# Patient Record
Sex: Male | Born: 1975 | Race: Black or African American | Hispanic: No | Marital: Married | Smoking: Current some day smoker
Health system: Southern US, Community
[De-identification: ages and names within clinical notes are randomized; demographics above are authoritative.]

## PROBLEM LIST (undated history)

## (undated) HISTORY — PX: BACK SURGERY: SHX140

---

## 2001-04-02 ENCOUNTER — Emergency Department (HOSPITAL_COMMUNITY): Admission: EM | Admit: 2001-04-02 | Discharge: 2001-04-02 | Payer: Self-pay | Admitting: Emergency Medicine

## 2001-04-02 ENCOUNTER — Encounter: Payer: Self-pay | Admitting: Emergency Medicine

## 2006-03-01 ENCOUNTER — Emergency Department (HOSPITAL_COMMUNITY): Admission: EM | Admit: 2006-03-01 | Discharge: 2006-03-01 | Payer: Self-pay | Admitting: Emergency Medicine

## 2006-03-03 ENCOUNTER — Emergency Department (HOSPITAL_COMMUNITY): Admission: EM | Admit: 2006-03-03 | Discharge: 2006-03-03 | Payer: Self-pay | Admitting: Emergency Medicine

## 2006-07-02 ENCOUNTER — Emergency Department (HOSPITAL_COMMUNITY): Admission: EM | Admit: 2006-07-02 | Discharge: 2006-07-02 | Payer: Self-pay | Admitting: Emergency Medicine

## 2010-06-08 ENCOUNTER — Emergency Department (HOSPITAL_COMMUNITY): Admission: EM | Admit: 2010-06-08 | Discharge: 2010-06-08 | Payer: Self-pay | Admitting: Emergency Medicine

## 2010-09-08 ENCOUNTER — Emergency Department (HOSPITAL_COMMUNITY): Admission: EM | Admit: 2010-09-08 | Discharge: 2010-09-08 | Payer: Self-pay | Admitting: Emergency Medicine

## 2010-12-21 ENCOUNTER — Emergency Department (HOSPITAL_COMMUNITY)
Admission: EM | Admit: 2010-12-21 | Discharge: 2010-12-21 | Disposition: A | Discharge status: Home / Self Care | Payer: Self-pay | Attending: Emergency Medicine | Admitting: Emergency Medicine

## 2010-12-21 DIAGNOSIS — L02219 Cutaneous abscess of trunk, unspecified: Secondary | ICD-10-CM | POA: Insufficient documentation

## 2010-12-24 LAB — CULTURE, ROUTINE-ABSCESS

## 2010-12-26 LAB — POCT I-STAT, CHEM 8
Chloride: 109 mEq/L (ref 96–112)
Glucose, Bld: 106 mg/dL — ABNORMAL HIGH (ref 70–99)
HCT: 44 % (ref 39.0–52.0)
Potassium: 3.6 mEq/L (ref 3.5–5.1)

## 2016-07-06 ENCOUNTER — Ambulatory Visit: Payer: Medicaid Other | Admitting: Unknown Physician Specialty

## 2020-02-16 ENCOUNTER — Other Ambulatory Visit
Admit: 2020-02-16 | Discharge: 2020-02-16 | Disposition: A | Discharge status: Home / Self Care | Attending: Family Medicine | Admitting: Family Medicine
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
## 2020-05-29 ENCOUNTER — Emergency Department: Admission: EM | Admit: 2020-05-29 | Source: Home / Self Care

## 2021-06-03 ENCOUNTER — Other Ambulatory Visit: Payer: Self-pay

## 2021-06-03 ENCOUNTER — Encounter (HOSPITAL_COMMUNITY): Payer: Self-pay | Admitting: *Deleted

## 2021-06-03 ENCOUNTER — Emergency Department (HOSPITAL_COMMUNITY): Payer: Self-pay

## 2021-06-03 ENCOUNTER — Emergency Department (HOSPITAL_COMMUNITY)
Admission: EM | Admit: 2021-06-03 | Discharge: 2021-06-03 | Disposition: A | Discharge status: Home / Self Care | Payer: Self-pay | Attending: Emergency Medicine | Admitting: Emergency Medicine

## 2021-06-03 DIAGNOSIS — Y92410 Unspecified street and highway as the place of occurrence of the external cause: Secondary | ICD-10-CM | POA: Insufficient documentation

## 2021-06-03 DIAGNOSIS — M25532 Pain in left wrist: Secondary | ICD-10-CM | POA: Diagnosis not present

## 2021-06-03 DIAGNOSIS — M25561 Pain in right knee: Secondary | ICD-10-CM | POA: Diagnosis not present

## 2021-06-03 DIAGNOSIS — R519 Headache, unspecified: Secondary | ICD-10-CM | POA: Insufficient documentation

## 2021-06-03 DIAGNOSIS — R1110 Vomiting, unspecified: Secondary | ICD-10-CM | POA: Diagnosis not present

## 2021-06-03 DIAGNOSIS — M546 Pain in thoracic spine: Secondary | ICD-10-CM | POA: Diagnosis not present

## 2021-06-03 DIAGNOSIS — F17210 Nicotine dependence, cigarettes, uncomplicated: Secondary | ICD-10-CM | POA: Insufficient documentation

## 2021-06-03 DIAGNOSIS — R42 Dizziness and giddiness: Secondary | ICD-10-CM | POA: Insufficient documentation

## 2021-06-03 DIAGNOSIS — M25562 Pain in left knee: Secondary | ICD-10-CM | POA: Insufficient documentation

## 2021-06-03 DIAGNOSIS — S6991XA Unspecified injury of right wrist, hand and finger(s), initial encounter: Secondary | ICD-10-CM | POA: Diagnosis present

## 2021-06-03 DIAGNOSIS — S61411A Laceration without foreign body of right hand, initial encounter: Secondary | ICD-10-CM | POA: Diagnosis not present

## 2021-06-03 MED ORDER — CYCLOBENZAPRINE HCL 10 MG PO TABS: 10.0000 mg | ORAL_TABLET | Freq: Two times a day (BID) | ORAL | 0 refills | Status: DC | PRN

## 2021-06-03 NOTE — Discharge Instructions (Addendum)
Imaging was all reassuring.  I suspect her pain is from a muscular strain recommend over-the-counter pain medications as needed.  I have also given you a prescription for a muscle relaxer this can make you drowsy do not consume alcohol or operate heavy machinery when taking this medication.  Headache likely a concussion, recommend brain rest, decreasing screen time, mentally stimulating activities, exercising and I would reintroduce them as tolerated.  Come back to the emergency department if you develop chest pain, shortness of breath, severe abdominal pain, uncontrolled nausea, vomiting, diarrhea.

## 2021-06-03 NOTE — ED Notes (Signed)
Pt ambulated to room 7. Pt verbalized he does not need ice pack. No jewelry to remove from knee or back/shoulders.
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
## 2021-06-03 NOTE — ED Triage Notes (Signed)
Also would like recheck of recently sutured right hand
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
## 2023-08-10 ENCOUNTER — Emergency Department (HOSPITAL_COMMUNITY)
Admission: EM | Admit: 2023-08-10 | Discharge: 2023-08-10 | Disposition: A | Discharge status: Home / Self Care | Payer: Medicaid Other | Source: Home / Self Care | Attending: Emergency Medicine | Admitting: Emergency Medicine

## 2023-08-10 ENCOUNTER — Emergency Department (HOSPITAL_COMMUNITY): Payer: Medicaid Other

## 2023-08-10 ENCOUNTER — Encounter (HOSPITAL_COMMUNITY): Payer: Self-pay

## 2023-08-10 ENCOUNTER — Other Ambulatory Visit: Payer: Self-pay

## 2023-08-10 DIAGNOSIS — Z1152 Encounter for screening for COVID-19: Secondary | ICD-10-CM | POA: Insufficient documentation

## 2023-08-10 DIAGNOSIS — J181 Lobar pneumonia, unspecified organism: Secondary | ICD-10-CM | POA: Insufficient documentation

## 2023-08-10 DIAGNOSIS — J189 Pneumonia, unspecified organism: Secondary | ICD-10-CM

## 2023-08-10 LAB — LACTIC ACID, PLASMA
Lactic Acid, Venous: 1.1 mmol/L (ref 0.5–1.9)
Lactic Acid, Venous: 0.8 mmol/L (ref 0.5–1.9)

## 2023-08-10 LAB — BASIC METABOLIC PANEL
Anion gap: 10 (ref 5–15)
BUN: 6 mg/dL (ref 6–20)
CO2: 26 mmol/L (ref 22–32)
Calcium: 8.5 mg/dL — ABNORMAL LOW (ref 8.9–10.3)
Chloride: 97 mmol/L — ABNORMAL LOW (ref 98–111)
Creatinine, Ser: 0.78 mg/dL (ref 0.61–1.24)
GFR, Estimated: >60 mL/min (ref >60)
Glucose, Bld: 199 mg/dL — ABNORMAL HIGH (ref 70–99)
Potassium: 3.8 mmol/L (ref 3.5–5.1)
Sodium: 133 mmol/L — ABNORMAL LOW (ref 135–145)

## 2023-08-10 LAB — CBC
HCT: 41 % (ref 39.0–52.0)
Hemoglobin: 13.6 g/dL (ref 13.0–17.0)
MCH: 32.7 pg (ref 26.0–34.0)
MCHC: 33.2 g/dL (ref 30.0–36.0)
MCV: 98.6 fL (ref 80.0–100.0)
Platelets: 263 10*3/uL (ref 150–400)
RBC: 4.16 MIL/uL — ABNORMAL LOW (ref 4.22–5.81)
RDW: 13.6 % (ref 11.5–15.5)
WBC: 14.5 10*3/uL — ABNORMAL HIGH (ref 4.0–10.5)
nRBC: 0 % (ref 0.0–0.2)

## 2023-08-10 LAB — PROTIME-INR
INR: 1.1 (ref 0.8–1.2)
Prothrombin Time: 14.7 s (ref 11.4–15.2)

## 2023-08-10 LAB — RESP PANEL BY RT-PCR (RSV, FLU A&B, COVID)  RVPGX2
Influenza A by PCR: NEGATIVE
Influenza B by PCR: NEGATIVE
Resp Syncytial Virus by PCR: NEGATIVE
SARS Coronavirus 2 by RT PCR: NEGATIVE

## 2023-08-10 LAB — TROPONIN I (HIGH SENSITIVITY): Troponin I (High Sensitivity): 2 ng/L (ref <18)

## 2023-08-10 LAB — APTT: aPTT: 30 s (ref 24–36)

## 2023-08-10 MED ORDER — HYDROCODONE-ACETAMINOPHEN 5-325 MG PO TABS: ORAL_TABLET | ORAL | 0 refills | Status: DC

## 2023-08-10 MED ORDER — ACETAMINOPHEN 500 MG PO TABS
1000.0000 mg | ORAL_TABLET | Freq: Once | ORAL | Status: AC
  Administered 2023-08-10: 1000 mg via ORAL
  Filled 2023-08-10: qty 2

## 2023-08-10 MED ORDER — DOXYCYCLINE HYCLATE 100 MG PO CAPS: 100.0000 mg | ORAL_CAPSULE | Freq: Two times a day (BID) | ORAL | 0 refills | Status: DC

## 2023-08-10 MED ORDER — SODIUM CHLORIDE 0.9 % IV SOLN
500.0000 mg | INTRAVENOUS | Status: DC
  Administered 2023-08-10: 500 mg via INTRAVENOUS
  Filled 2023-08-10: qty 5

## 2023-08-10 MED ORDER — SODIUM CHLORIDE 0.9 % IV BOLUS
1000.0000 mL | Freq: Once | INTRAVENOUS | Status: AC
Start: 2023-08-10 — End: 2023-08-10
  Administered 2023-08-10: 1000 mL via INTRAVENOUS

## 2023-08-10 MED ORDER — SODIUM CHLORIDE 0.9 % IV SOLN
2.0000 g | INTRAVENOUS | Status: DC
  Administered 2023-08-10: 2 g via INTRAVENOUS
  Filled 2023-08-10: qty 20

## 2023-08-10 NOTE — ED Provider Notes (Signed)
[facility] EMERGENCY DEPARTMENT AT [facility] Provider Note   CSN: [id number] Arrival date & time: 08/10/23  1200     History {Add pertinent medical, surgical, social history, OB history to HPI:1} Chief Complaint  Patient presents with   Chest Pain   Shortness of Breath    Glenn Oconnor is a 47 y.o. male.  Patient complains of pain in left chest.  Minimal cough   Chest Pain Associated symptoms: shortness of breath   Shortness of Breath Associated symptoms: chest pain        Home Medications Prior to Admission medications   Medication Sig Start Date End Date Taking? Authorizing Provider  doxycycline (VIBRAMYCIN) 100 MG capsule Take 1 capsule (100 mg total) by mouth 2 (two) times daily. One po bid x 7 days 08/10/23  Yes Bethann Berkshire, MD  HYDROcodone-acetaminophen (NORCO/VICODIN) 5-325 MG tablet Take 1 every 6 hours as needed for pain not relieved by Tylenol or Motrin alone 08/10/23  Yes Bethann Berkshire, MD  cyclobenzaprine (FLEXERIL) 10 MG tablet Take 1 tablet (10 mg total) by mouth 2 (two) times daily as needed for muscle spasms. 06/03/21   Carroll Sage, PA-C  levofloxacin (LEVAQUIN) 500 MG tablet Take 500 mg by mouth daily. 05/27/21   [provider]      Allergies    Patient has no known allergies.    Review of Systems   Review of Systems  Respiratory:  Positive for shortness of breath.   Cardiovascular:  Positive for chest pain.    Physical Exam Updated Vital Signs BP 113/63   Pulse 71   Temp (!) 101.9 F (38.8 C) (Oral)   Resp (!) 26   Ht 6' (1.829 m)   Wt 77.1 kg   SpO2 98%   BMI 23.06 kg/m  Physical Exam  ED Results / Procedures / Treatments   Labs (all labs ordered are listed, but only abnormal results are displayed) Labs Reviewed  BASIC METABOLIC PANEL - Abnormal; Notable for the following components:      Result Value   Sodium 133 (*)    Chloride 97 (*)    Glucose, Bld 199 (*)    Calcium 8.5 (*)    All other  components within normal limits  CBC - Abnormal; Notable for the following components:   WBC 14.5 (*)    RBC 4.16 (*)    All other components within normal limits  RESP PANEL BY RT-PCR (RSV, FLU A&B, COVID)  RVPGX2  CULTURE, BLOOD (ROUTINE X 2)  CULTURE, BLOOD (ROUTINE X 2)  LACTIC ACID, PLASMA  PROTIME-INR  APTT  LACTIC ACID, PLASMA  URINALYSIS, W/ REFLEX TO CULTURE (INFECTION SUSPECTED)  TROPONIN I (HIGH SENSITIVITY)    EKG None  Radiology DG Chest 2 View  Result Date: 08/10/2023 CLINICAL DATA:  Chest pain. EXAM: CHEST - 2 VIEW COMPARISON:  None Available. FINDINGS: There is an approximately 3.5 x 5.5 cm opacity overlying the left lateral costophrenic angle with associated small left pleural effusion. Follow-up to clearing is recommended. Bilateral lung fields are otherwise clear. Right costophrenic angle is clear. Normal cardio-mediastinal silhouette. No acute osseous abnormalities. The soft tissues are within normal limits. IMPRESSION: *Small left lung lower lobe pneumonia with associated small left pleural effusion. Follow-up to clearing is recommended. Electronically Signed   By: Jules Schick M.D.   On: 08/10/2023 13:39    Procedures Procedures  {Document cardiac monitor, telemetry assessment procedure when appropriate:1}  Medications Ordered in ED Medications  cefTRIAXone (  ROCEPHIN) 2 g in sodium chloride 0.9 % 100 mL IVPB (0 g Intravenous Stopped 08/10/23 1338)  azithromycin (ZITHROMAX) 500 mg in sodium chloride 0.9 % 250 mL IVPB (500 mg Intravenous New Bag/Given 08/10/23 1340)  acetaminophen (TYLENOL) tablet 1,000 mg (1,000 mg Oral Given 08/10/23 1213)  sodium chloride 0.9 % bolus 1,000 mL (1,000 mLs Intravenous New Bag/Given 08/10/23 1304)    ED Course/ Medical Decision Making/ A&P   {   Click here for ABCD2, HEART and other calculatorsREFRESH Note before signing :1}                              Medical Decision Making Amount and/or Complexity of Data  Reviewed Labs: ordered. Radiology: ordered. ECG/medicine tests: ordered.  Risk OTC drugs. Prescription drug management.  Patient with left lower lobe pneumonia.  Patient is not hypoxic or toxic and will be treated with doxycycline as an outpatient follow-up with PCP  {Document critical care time when appropriate:1} {Document review of labs and clinical decision tools ie heart score, Chads2Vasc2 etc:1}  {Document your independent review of radiology images, and any outside records:1} {Document your discussion with family members, caretakers, and with consultants:1} {Document social determinants of health affecting pt's care:1} {Document your decision making why or why not admission, treatments were needed:1} Final Clinical Impression(s) / ED Diagnoses Final diagnoses:  Community acquired pneumonia of left lower lobe of lung    Rx / DC Orders ED Discharge Orders          Ordered    doxycycline (VIBRAMYCIN) 100 MG capsule  2 times daily        08/10/23 1421    HYDROcodone-acetaminophen (NORCO/VICODIN) 5-325 MG tablet        08/10/23 1421

## 2023-08-10 NOTE — Sepsis Progress Note (Signed)
Confirmed with bedside RN [place] that blood cultures were done before the antibiotic was given.

## 2023-08-10 NOTE — Discharge Instructions (Signed)
Follow up with your md next week or sooner if not improving.
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
## 2023-08-10 NOTE — Sepsis Progress Note (Signed)
eLink is following this Code Sepsis. °

## 2023-08-11 ENCOUNTER — Encounter (HOSPITAL_COMMUNITY): Payer: Self-pay | Admitting: Emergency Medicine

## 2023-08-11 ENCOUNTER — Emergency Department (HOSPITAL_COMMUNITY): Payer: Medicaid Other

## 2023-08-11 ENCOUNTER — Inpatient Hospital Stay (HOSPITAL_COMMUNITY)
Admission: EM | Admit: 2023-08-11 | Discharge: 2023-08-14 | DRG: 175 | Disposition: A | Discharge status: Home / Self Care | Payer: Medicaid Other | Attending: Family Medicine | Admitting: Family Medicine

## 2023-08-11 ENCOUNTER — Other Ambulatory Visit: Payer: Self-pay

## 2023-08-11 DIAGNOSIS — Z1152 Encounter for screening for COVID-19: Secondary | ICD-10-CM | POA: Diagnosis not present

## 2023-08-11 DIAGNOSIS — I82409 Acute embolism and thrombosis of unspecified deep veins of unspecified lower extremity: Secondary | ICD-10-CM | POA: Diagnosis present

## 2023-08-11 DIAGNOSIS — I82402 Acute embolism and thrombosis of unspecified deep veins of left lower extremity: Secondary | ICD-10-CM | POA: Diagnosis not present

## 2023-08-11 DIAGNOSIS — F17210 Nicotine dependence, cigarettes, uncomplicated: Secondary | ICD-10-CM | POA: Diagnosis present

## 2023-08-11 DIAGNOSIS — J189 Pneumonia, unspecified organism: Secondary | ICD-10-CM | POA: Diagnosis present

## 2023-08-11 DIAGNOSIS — Z720 Tobacco use: Secondary | ICD-10-CM

## 2023-08-11 DIAGNOSIS — I2699 Other pulmonary embolism without acute cor pulmonale: Secondary | ICD-10-CM | POA: Diagnosis not present

## 2023-08-11 DIAGNOSIS — I82432 Acute embolism and thrombosis of left popliteal vein: Secondary | ICD-10-CM | POA: Diagnosis present

## 2023-08-11 DIAGNOSIS — I82442 Acute embolism and thrombosis of left tibial vein: Secondary | ICD-10-CM | POA: Diagnosis present

## 2023-08-11 DIAGNOSIS — R0602 Shortness of breath: Secondary | ICD-10-CM | POA: Diagnosis present

## 2023-08-11 DIAGNOSIS — I2609 Other pulmonary embolism with acute cor pulmonale: Secondary | ICD-10-CM | POA: Diagnosis not present

## 2023-08-11 LAB — CBC
HCT: 35.5 % — ABNORMAL LOW (ref 39.0–52.0)
Hemoglobin: 12 g/dL — ABNORMAL LOW (ref 13.0–17.0)
MCH: 32.7 pg (ref 26.0–34.0)
MCHC: 33.8 g/dL (ref 30.0–36.0)
MCV: 96.7 fL (ref 80.0–100.0)
Platelets: 265 10*3/uL (ref 150–400)
RBC: 3.67 MIL/uL — ABNORMAL LOW (ref 4.22–5.81)
RDW: 12.9 % (ref 11.5–15.5)
WBC: 15.2 10*3/uL — ABNORMAL HIGH (ref 4.0–10.5)
nRBC: 0 % (ref 0.0–0.2)

## 2023-08-11 LAB — MRSA NEXT GEN BY PCR, NASAL: MRSA by PCR Next Gen: NOT DETECTED

## 2023-08-11 LAB — COMPREHENSIVE METABOLIC PANEL
ALT: 14 U/L (ref 0–44)
AST: 18 U/L (ref 15–41)
Albumin: 2.9 g/dL — ABNORMAL LOW (ref 3.5–5.0)
Alkaline Phosphatase: 85 U/L (ref 38–126)
Anion gap: 9 (ref 5–15)
BUN: 5 mg/dL — ABNORMAL LOW (ref 6–20)
CO2: 24 mmol/L (ref 22–32)
Calcium: 8.4 mg/dL — ABNORMAL LOW (ref 8.9–10.3)
Chloride: 100 mmol/L (ref 98–111)
Creatinine, Ser: 0.65 mg/dL (ref 0.61–1.24)
GFR, Estimated: >60 mL/min (ref >60)
Glucose, Bld: 151 mg/dL — ABNORMAL HIGH (ref 70–99)
Potassium: 3.5 mmol/L (ref 3.5–5.1)
Sodium: 133 mmol/L — ABNORMAL LOW (ref 135–145)
Total Bilirubin: 1 mg/dL (ref 0.3–1.2)
Total Protein: 7.8 g/dL (ref 6.5–8.1)

## 2023-08-11 LAB — ABO/RH: ABO/RH(D): A POS

## 2023-08-11 LAB — TYPE AND SCREEN
ABO/RH(D): A POS
Antibody Screen: NEGATIVE

## 2023-08-11 LAB — PROCALCITONIN: Procalcitonin: 0.16 ng/mL

## 2023-08-11 LAB — HEPARIN LEVEL (UNFRACTIONATED): Heparin Unfractionated: <0.1 [IU]/mL — ABNORMAL LOW (ref 0.30–0.70)

## 2023-08-11 MED ORDER — ACETAMINOPHEN 325 MG PO TABS
650.0000 mg | ORAL_TABLET | Freq: Four times a day (QID) | ORAL | Status: DC | PRN
  Administered 2023-08-11 – 2023-08-12 (×2): 650 mg via ORAL
  Filled 2023-08-11 (×2): qty 2

## 2023-08-11 MED ORDER — IOHEXOL 350 MG/ML SOLN
75.0000 mL | Freq: Once | INTRAVENOUS | Status: AC | PRN
  Administered 2023-08-11: 75 mL via INTRAVENOUS

## 2023-08-11 MED ORDER — GUAIFENESIN-DM 100-10 MG/5ML PO SYRP
15.0000 mL | ORAL_SOLUTION | Freq: Three times a day (TID) | ORAL | Status: AC
  Administered 2023-08-11 – 2023-08-12 (×3): 15 mL via ORAL
  Filled 2023-08-11 (×3): qty 15

## 2023-08-11 MED ORDER — ONDANSETRON HCL 4 MG PO TABS: 4.0000 mg | ORAL_TABLET | Freq: Four times a day (QID) | ORAL | Status: DC | PRN

## 2023-08-11 MED ORDER — SODIUM CHLORIDE 0.9 % IV SOLN
2.0000 g | INTRAVENOUS | Status: DC
  Administered 2023-08-11 – 2023-08-13 (×3): 2 g via INTRAVENOUS
  Filled 2023-08-11 (×3): qty 20

## 2023-08-11 MED ORDER — HEPARIN (PORCINE) 25000 UT/250ML-% IV SOLN
1600.0000 [IU]/h | INTRAVENOUS | Status: DC
  Administered 2023-08-11: 1250 [IU]/h via INTRAVENOUS
  Administered 2023-08-12: 1600 [IU]/h via INTRAVENOUS
  Administered 2023-08-12: 1450 [IU]/h via INTRAVENOUS
  Filled 2023-08-11 (×2): qty 250

## 2023-08-11 MED ORDER — CHLORHEXIDINE GLUCONATE CLOTH 2 % EX PADS
6.0000 | MEDICATED_PAD | Freq: Every day | CUTANEOUS | Status: DC
  Administered 2023-08-12 – 2023-08-13 (×2): 6 via TOPICAL

## 2023-08-11 MED ORDER — HEPARIN BOLUS VIA INFUSION
4000.0000 [IU] | Freq: Once | INTRAVENOUS | Status: AC
  Administered 2023-08-11: 4000 [IU] via INTRAVENOUS

## 2023-08-11 MED ORDER — POLYETHYLENE GLYCOL 3350 17 G PO PACK: 17.0000 g | PACK | Freq: Every day | ORAL | Status: DC | PRN

## 2023-08-11 MED ORDER — POTASSIUM CHLORIDE CRYS ER 20 MEQ PO TBCR
40.0000 meq | EXTENDED_RELEASE_TABLET | Freq: Once | ORAL | Status: AC
  Administered 2023-08-11: 40 meq via ORAL
  Filled 2023-08-11: qty 2

## 2023-08-11 MED ORDER — HEPARIN BOLUS VIA INFUSION
3000.0000 [IU] | Freq: Once | INTRAVENOUS | Status: AC
  Administered 2023-08-11: 3000 [IU] via INTRAVENOUS
  Filled 2023-08-11: qty 3000

## 2023-08-11 MED ORDER — ONDANSETRON HCL 4 MG/2ML IJ SOLN: 4.0000 mg | Freq: Four times a day (QID) | INTRAMUSCULAR | Status: DC | PRN

## 2023-08-11 MED ORDER — HYDROCODONE-ACETAMINOPHEN 5-325 MG PO TABS
1.0000 | ORAL_TABLET | Freq: Four times a day (QID) | ORAL | Status: DC | PRN
  Administered 2023-08-11: 1 via ORAL
  Filled 2023-08-11: qty 1

## 2023-08-11 MED ORDER — SODIUM CHLORIDE 0.9 % IV SOLN
500.0000 mg | INTRAVENOUS | Status: DC
  Administered 2023-08-11: 500 mg via INTRAVENOUS
  Filled 2023-08-11: qty 5

## 2023-08-11 MED ORDER — ORAL CARE MOUTH RINSE: 15.0000 mL | OROMUCOSAL | Status: DC | PRN

## 2023-08-11 NOTE — Progress Notes (Signed)
PHARMACY - ANTICOAGULATION CONSULT NOTE  Pharmacy Consult for Heparin Indication: pulmonary embolus  No Known Allergies  Patient Measurements: Height: 6' (182.9 cm) Weight: 77.1 kg (169 lb 15.6 oz) IBW/kg (Calculated) : 77.6 HEPARIN DW (KG): 77.1   Vital Signs: Temp: 98 F (36.7 C) (10/30 1515) Temp Source: Oral (10/30 1151) BP: 115/71 (10/30 1515) Pulse Rate: 82 (10/30 1515)  Labs: Recent Labs    08/10/23 1232 08/11/23 1218  HGB 13.6 12.0*  HCT 41.0 35.5*  PLT 263 265  APTT 30  --   LABPROT 14.7  --   INR 1.1  --   CREATININE 0.78 0.65  TROPONINIHS 2  --     Estimated Creatinine Clearance: 127.2 mL/min (by C-G formula based on SCr of 0.65 mg/dL).   Medical History: History reviewed. No pertinent past medical history.  Medications:  See med rec  Assessment: Patient is 47 yo male who presented yesterday with SOB and has LEFT Flank pain x3 and fever. Today patient presents with hemoptysis. Patient is not on oral anticoagulants. CTAngiogram is + Pulmonary Embolus. Pharmacy asked to start heparin.   Goal of Therapy:  Heparin level 0.3-0.7 units/ml Monitor platelets by anticoagulation protocol: Yes   Plan:  Give 4000 units bolus x 1 Start heparin infusion at 1250 units/hr Check anti-Xa level in ~6 hours and daily while on heparin Continue to monitor H&H and platelets  Elder Cyphers, BS Pharm D, BCPS Clinical Pharmacist 08/11/2023,4:24 PM

## 2023-08-11 NOTE — Assessment & Plan Note (Signed)
Smokes half a pack of cigarettes daily.

## 2023-08-11 NOTE — Progress Notes (Signed)
   PCCM transfer request    Sending physician: Suzan Nailer PA  Sending facility: [facility]  Reason for transfer:  Low risk PE with hemoptysis  Brief case summary:  47 year old found to have PE. Recently diagnosed with Pna re-presented with hemoptysis. CTA revealed PE without sign of R heart strain, pulmonary infarct - likely cause of CXR infiltrate prior.   Recommendations made prior to transfer: --heparin, observe, once hemoptysis improves consider discharge  Transfer accepted: no    Glenn Oconnor 08/11/23 5:54 PM [facility]  For contact information, see Amion. If no response to pager, please call PCCM consult pager. After hours, 7PM- 7AM, please call Elink.

## 2023-08-11 NOTE — H&P (Signed)
History and Physical    Oak E Attar ZOX:[id number] DOB: June 24, 1976 DOA: 08/11/2023  PCP: Pcp, No   Patient coming from: Home  I have personally briefly reviewed patient's old medical records in [facility]  Chief Complaint: Difficulty Breathing  HPI: Glenn Oconnor is a 47 y.o. male with medical history significant for tobacco use.  Patient presented to the ED with complaints of difficulty breathing and coughing up blood.  Patient was in the ED yesterday with complaints of difficulty breathing, right sided chest pains, fevers of up to 102 at home.  Chest x-ray done in the ED showed small left lower lobe pneumonia with associated small left pleural effusion, he was given ceftriaxone and azithromycin in the ED, discharged with a prescription for doxycycline, he was started on doxycycline and Norco.  This morning at about 3 AM, he started coughing up blood. He reports previously he had tried not to cough, but after taking the pain medication, he had relief of the chest pain and he was able to cough.  He coughed up blood 4 times, it was dark and thick.  Last time he coughed up blood was 9 AM this morning.  He reports all this started with cramping pain to his bilateral calfs without swelling or redness, that started  about 8 days ago, later moved up to his hip and then he started having chest pain about 5 days ago No personal or family history of blood clots.  He denies previous history of blood clots, no recent trips-lights on long drives, no recent surgeries.  Had back surgery back in February but he has since been ambulatory, and active.  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  No diagnosis of cancer.  Not on any medications.  ED Course: Blood pressure 112-131.  Heart rate 80-98.  Tmax 99.8.  WBC 15.2.  Hemoglobin 12. CTA chest showed acute PE- Acute pulmonary emboli within the left main pulmonary artery extending to the left interlobar artery and the upper and lower lobe  segmental branches. Acute pulmonary emboli also noted in the right lower lobe segmental branches. 2. Peripheral predominant wedge-shaped opacities and patchy ground-glass changes within the posterior left lower lobe are concerning for infarction. IV heparin started. EDP talked to Dr. Judeth Horn- recommended, observe, once hemoptysis improves consider discharge.Raelyn Number low risk PE with hemoptysis.  Review of Systems: As per HPI all other systems reviewed and negative.  History reviewed. No pertinent past medical history.  Past Surgical History:  Procedure Laterality Date   BACK SURGERY       reports that he has been smoking cigarettes. He has never used smokeless tobacco. He reports current alcohol use. He reports that he does not use drugs.  No Known Allergies  No family history of blood clots.  Prior to Admission medications   Medication Sig Start Date End Date Taking? Authorizing Provider  doxycycline (VIBRAMYCIN) 100 MG capsule Take 1 capsule (100 mg total) by mouth 2 (two) times daily. One po bid x 7 days 08/10/23  Yes Bethann Berkshire, MD  HYDROcodone-acetaminophen (NORCO/VICODIN) 5-325 MG tablet Take 1 every 6 hours as needed for pain not relieved by Tylenol or Motrin alone 08/10/23  Yes Bethann Berkshire, MD  ibuprofen (ADVIL) 200 MG tablet Take 200 mg by mouth every 6 (six) hours as needed for mild pain (pain score 1-3).   Yes [provider]  naproxen sodium (ALEVE) 220 MG tablet Take 220 mg by mouth  daily as needed (pain).   Yes [provider]    Physical Exam: Vitals:   08/11/23 1151 08/11/23 1151 08/11/23 1459 08/11/23 1515  BP:  113/70 112/77 115/71  Pulse:  92 80 82  Resp:  18 20 17   Temp:  99.8 F (37.7 C)  98 F (36.7 C)  TempSrc:  Oral    SpO2:  100% 97% 97%  Weight: 77.1 kg     Height: 6' (1.829 m)       Constitutional: NAD, calm, comfortable Vitals:   08/11/23 1151 08/11/23 1151 08/11/23 1459 08/11/23 1515  BP:  113/70 112/77 115/71   Pulse:  92 80 82  Resp:  18 20 17   Temp:  99.8 F (37.7 C)  98 F (36.7 C)  TempSrc:  Oral    SpO2:  100% 97% 97%  Weight: 77.1 kg     Height: 6' (1.829 m)      Eyes: PERRL, lids and conjunctivae normal ENMT: Mucous membranes are moist.  Neck: normal, supple, no masses, no thyromegaly Respiratory: clear to auscultation bilaterally, no wheezing, no crackles. Normal respiratory effort. No accessory muscle use.  Cardiovascular: Regular rate and rhythm, no murmurs / rubs / gallops. No extremity edema.  Extremities warm.   Abdomen: no tenderness, no masses palpated. No hepatosplenomegaly. Bowel sounds positive.  Musculoskeletal: no clubbing / cyanosis. No joint deformity upper and lower extremities.  Neurologic: No facial symmetry, moving extremities spontaneously. Psychiatric: Normal judgment and insight. Alert and oriented x 3. Normal mood.   Labs on Admission: I have personally reviewed following labs and imaging studies  CBC: Recent Labs  Lab 08/10/23 1232 08/11/23 1218  WBC 14.5* 15.2*  HGB 13.6 12.0*  HCT 41.0 35.5*  MCV 98.6 96.7  PLT 263 265   Basic Metabolic Panel: Recent Labs  Lab 08/10/23 1232 08/11/23 1218  NA 133* 133*  K 3.8 3.5  CL 97* 100  CO2 26 24  GLUCOSE 199* 151*  BUN 6 5*  CREATININE 0.78 0.65  CALCIUM 8.5* 8.4*   GFR: Estimated Creatinine Clearance: 127.2 mL/min (by C-G formula based on SCr of 0.65 mg/dL). Liver Function Tests: Recent Labs  Lab 08/11/23 1218  AST 18  ALT 14  ALKPHOS 85  BILITOT 1.0  PROT 7.8  ALBUMIN 2.9*   Coagulation Profile: Recent Labs  Lab 08/10/23 1232  INR 1.1    Radiological Exams on Admission: CT Angio Chest PE W and/or Wo Contrast  Result Date: 08/11/2023 CLINICAL DATA:  Pulmonary embolism (PE) suspected, high prob. Hematemesis. EXAM: CT ANGIOGRAPHY CHEST WITH CONTRAST TECHNIQUE: Multidetector CT imaging of the chest was performed using the standard protocol during bolus administration of intravenous  contrast. Multiplanar CT image reconstructions and MIPs were obtained to evaluate the vascular anatomy. RADIATION DOSE REDUCTION: This exam was performed according to the departmental dose-optimization program which includes automated exposure control, adjustment of the mA and/or kV according to patient size and/or use of iterative reconstruction technique. CONTRAST:  75mL OMNIPAQUE IOHEXOL 350 MG/ML SOLN COMPARISON:  None Available. FINDINGS: Cardiovascular: Satisfactory opacification of the pulmonary arteries to the segmental level. Intraluminal filling defect compatible with acute pulmonary embolism within the left main pulmonary artery, at the bifurcation of the left upper lobe segmental branches and left interlobar artery, with filling defects extending into the posterior left upper lobe segmental branches, throughout the left interlobar artery and distally into multiple left lower lobe segmental branches. Intraluminal filling defect is also seen within the right lower lobe segmental branches. Normal  heart size. No definite evidence of right heart strain. No pericardial effusion. Mediastinum/Nodes: No enlarged mediastinal, hilar, or axillary lymph nodes. Thyroid gland, trachea, and esophagus demonstrate no significant findings. Lungs/Pleura: Small left pleural effusion with basilar atelectasis. Patchy ground-glass changes with peripheral predominant wedge-shaped opacities within the posterior left lower lobe. Linear atelectasis at the right lung base. No pneumothorax. Upper Abdomen: No acute abnormality. Musculoskeletal: No acute osseous abnormality. Review of the MIP images confirms the above findings. IMPRESSION: 1. Acute pulmonary emboli within the left main pulmonary artery extending to the left interlobar artery and the upper and lower lobe segmental branches. Acute pulmonary emboli also noted in the right lower lobe segmental branches. 2. Peripheral predominant wedge-shaped opacities and patchy  ground-glass changes within the posterior left lower lobe are concerning for infarction. An underlying infectious/inflammatory process can not be excluded. 3. Small left pleural effusion. These results were called by telephone at the time of interpretation on 08/11/2023 at 4:07 pm to provider Meridee Score, MD, who verbally acknowledged these results. Electronically Signed   By: Hart Robinsons M.D.   On: 08/11/2023 16:23   DG Chest 2 View  Result Date: 08/10/2023 CLINICAL DATA:  Chest pain. EXAM: CHEST - 2 VIEW COMPARISON:  None Available. FINDINGS: There is an approximately 3.5 x 5.5 cm opacity overlying the left lateral costophrenic angle with associated small left pleural effusion. Follow-up to clearing is recommended. Bilateral lung fields are otherwise clear. Right costophrenic angle is clear. Normal cardio-mediastinal silhouette. No acute osseous abnormalities. The soft tissues are within normal limits. IMPRESSION: *Small left lung lower lobe pneumonia with associated small left pleural effusion. Follow-up to clearing is recommended. Electronically Signed   By: Jules Schick M.D.   On: 08/10/2023 13:39    EKG: Independently reviewed.  Sinus rhythm, rate 75, QTc 428.  No significant ST or T wave changes from prior.  Assessment/Plan Principal Problem:   Acute pulmonary embolism (HCC) Active Problems:   Tobacco abuse  Assessment and Plan: * Acute pulmonary embolism (HCC) Appears to be unprovoked PE.  Presenting with hemoptysis, fevers, chest pain, difficulty breathing, cough pains without swelling.  So far no risk factors identified.  Stable vitals, not tachycardic, blood pressure stable. Tobacco abuse. Not on any medications.   CTA chest shows acute PE and left main pulmonary artery, extending to the left interlobar artery, segmental branches, wedge-shaped opacities within posterior left lower lobe concerning for infarction. -IV heparin - EDP talked to pulmonologist Dr. Judeth Horn, low risk  PE with hemoptysis, okay to stay here at [facility], heparin, observe, once hemoptysis improves consider discharge. - ECHO - Bilateral LE venous dopplers -Per CTA - infectious/inflammatory process not excluded, reported fevers which may be from infarct and PE, but also with leukocytosis of 15.2 -Check pro-calcitonin -Continue IV ceftriaxone and azithromycin for now  Tobacco abuse Smokes half a pack of cigarettes daily.   DVT prophylaxis: heparin Code Status: Full code Family Communication: Spouse at bedside Disposition Plan: ~ 2 days Consults called: None Admission status: Inpt Stepdown I certify that at the point of admission it is my clinical judgment that the patient will require inpatient hospital care spanning beyond 2 midnights from the point of admission due to high intensity of service, high risk for further deterioration and high frequency of surveillance required.  Author: Onnie Boer, MD 08/11/2023 7:20 PM  For on call review [email or web address].

## 2023-08-11 NOTE — Progress Notes (Signed)
PHARMACY - ANTICOAGULATION CONSULT NOTE  Pharmacy Consult for Heparin Indication: pulmonary embolus  No Known Allergies  Patient Measurements: Height: 6' (182.9 cm) Weight: 74.6 kg (164 lb 7.4 oz) IBW/kg (Calculated) : 77.6 HEPARIN DW (KG): 74.6   Vital Signs: Temp: 100.3 F (37.9 C) (10/30 2000) Temp Source: Oral (10/30 2000) BP: 125/56 (10/30 2300) Pulse Rate: 98 (10/30 2300)  Labs: Recent Labs    08/10/23 1232 08/11/23 1218 08/11/23 2218  HGB 13.6 12.0*  --   HCT 41.0 35.5*  --   PLT 263 265  --   APTT 30  --   --   LABPROT 14.7  --   --   INR 1.1  --   --   HEPARINUNFRC  --   --  <0.10*  CREATININE 0.78 0.65  --   TROPONINIHS 2  --   --     Estimated Creatinine Clearance: 123 mL/min (by C-G formula based on SCr of 0.65 mg/dL).   Medical History: History reviewed. No pertinent past medical history.  Medications:  See med rec  Assessment: Patient is 47 yo male who presented yesterday with SOB and has LEFT Flank pain x3 and fever. Today patient presents with hemoptysis. Patient is not on oral anticoagulants. CTAngiogram is + Pulmonary Embolus. Pharmacy asked to start heparin.   10/30 PM update:  Heparin level sub-therapeutic   Goal of Therapy:  Heparin level 0.3-0.7 units/ml Monitor platelets by anticoagulation protocol: Yes   Plan:  Heparin 3000 units re-bolus Inc heparin to 1450 units/hr Heparin level in 6-8 hours  Abran Duke, PharmD, BCPS Clinical Pharmacist Phone: [phone]

## 2023-08-11 NOTE — ED Notes (Signed)
Attempted to call report. Nurse will call back.

## 2023-08-11 NOTE — ED Provider Notes (Signed)
[facility] AT [facility] Provider Note   CSN: [id number] Arrival date & time: 08/11/23  1104     History  Chief Complaint  Patient presents with   Hemoptysis    Glenn Oconnor is a 47 y.o. male.  He denies he has no significant PMH, he is a smoker presents ER today for hemoptysis.  He was seen yesterday for fever, cough and chest pain, was diagnosed with pneumonia and treated with antibiotics and sent home with hydrocodone.  He states this helped the pain but today he was having a lot of what he thought was sputum production with his cough in bed, you spitting into a cup but when he got up and he looked he states it was "just blood".  He is not on blood thinners.  No recent injury or trauma no leg swelling or pain, he has never had this in the past.  HPI     Home Medications Prior to Admission medications   Medication Sig Start Date End Date Taking? Authorizing Provider  doxycycline (VIBRAMYCIN) 100 MG capsule Take 1 capsule (100 mg total) by mouth 2 (two) times daily. One po bid x 7 days 08/10/23  Yes Bethann Berkshire, MD  HYDROcodone-acetaminophen (NORCO/VICODIN) 5-325 MG tablet Take 1 every 6 hours as needed for pain not relieved by Tylenol or Motrin alone 08/10/23  Yes Bethann Berkshire, MD  ibuprofen (ADVIL) 200 MG tablet Take 200 mg by mouth every 6 (six) hours as needed for mild pain (pain score 1-3).   Yes [provider]  naproxen sodium (ALEVE) 220 MG tablet Take 220 mg by mouth daily as needed (pain).   Yes [provider]      Allergies    Patient has no known allergies.    Review of Systems   Review of Systems  Physical Exam Updated Vital Signs BP 115/71   Pulse 82   Temp 98 F (36.7 C)   Resp 17   Ht 6' (1.829 m)   Wt 77.1 kg   SpO2 97%   BMI 23.05 kg/m  Physical Exam Vitals and nursing note reviewed.  Constitutional:      General: He is not in acute distress.    Appearance: He is well-developed.  HENT:      Head: Normocephalic and atraumatic.     Mouth/Throat:     Mouth: Mucous membranes are moist.  Eyes:     Conjunctiva/sclera: Conjunctivae normal.  Cardiovascular:     Rate and Rhythm: Normal rate and regular rhythm.     Heart sounds: No murmur heard. Pulmonary:     Effort: Pulmonary effort is normal. No respiratory distress.     Breath sounds: Normal breath sounds.  Abdominal:     Palpations: Abdomen is soft.     Tenderness: There is no abdominal tenderness.  Musculoskeletal:        General: No swelling.     Cervical back: Neck supple.     Right lower leg: No edema.     Left lower leg: No edema.  Skin:    General: Skin is warm and dry.     Capillary Refill: Capillary refill takes less than 2 seconds.  Neurological:     Mental Status: He is alert.  Psychiatric:        Mood and Affect: Mood normal.     ED Results / Procedures / Treatments   Labs (all labs ordered are listed, but only abnormal results are displayed) Labs Reviewed  COMPREHENSIVE METABOLIC PANEL - Abnormal; Notable for the following components:      Result Value   Sodium 133 (*)    Glucose, Bld 151 (*)    BUN 5 (*)    Calcium 8.4 (*)    Albumin 2.9 (*)    All other components within normal limits  CBC - Abnormal; Notable for the following components:   WBC 15.2 (*)    RBC 3.67 (*)    Hemoglobin 12.0 (*)    HCT 35.5 (*)    All other components within normal limits  HEPARIN LEVEL (UNFRACTIONATED)  HEPARIN LEVEL (UNFRACTIONATED)  CBC  POC OCCULT BLOOD, ED  TYPE AND SCREEN  ABO/RH    EKG EKG Interpretation Date/Time:  Wednesday August 11 2023 15:17:54 EDT Ventricular Rate:  75 PR Interval:  150 QRS Duration:  84 QT Interval:  383 QTC Calculation: 428 R Axis:   70  Text Interpretation: Sinus rhythm rate slower than prior yesterday Confirmed by Meridee Score [phone]) on 08/11/2023 3:36:05 PM  Radiology CT Angio Chest PE W and/or Wo Contrast  Result Date: 08/11/2023 CLINICAL DATA:   Pulmonary embolism (PE) suspected, high prob. Hematemesis. EXAM: CT ANGIOGRAPHY CHEST WITH CONTRAST TECHNIQUE: Multidetector CT imaging of the chest was performed using the standard protocol during bolus administration of intravenous contrast. Multiplanar CT image reconstructions and MIPs were obtained to evaluate the vascular anatomy. RADIATION DOSE REDUCTION: This exam was performed according to the departmental dose-optimization program which includes automated exposure control, adjustment of the mA and/or kV according to patient size and/or use of iterative reconstruction technique. CONTRAST:  75mL OMNIPAQUE IOHEXOL 350 MG/ML SOLN COMPARISON:  None Available. FINDINGS: Cardiovascular: Satisfactory opacification of the pulmonary arteries to the segmental level. Intraluminal filling defect compatible with acute pulmonary embolism within the left main pulmonary artery, at the bifurcation of the left upper lobe segmental branches and left interlobar artery, with filling defects extending into the posterior left upper lobe segmental branches, throughout the left interlobar artery and distally into multiple left lower lobe segmental branches. Intraluminal filling defect is also seen within the right lower lobe segmental branches. Normal heart size. No definite evidence of right heart strain. No pericardial effusion. Mediastinum/Nodes: No enlarged mediastinal, hilar, or axillary lymph nodes. Thyroid gland, trachea, and esophagus demonstrate no significant findings. Lungs/Pleura: Small left pleural effusion with basilar atelectasis. Patchy ground-glass changes with peripheral predominant wedge-shaped opacities within the posterior left lower lobe. Linear atelectasis at the right lung base. No pneumothorax. Upper Abdomen: No acute abnormality. Musculoskeletal: No acute osseous abnormality. Review of the MIP images confirms the above findings. IMPRESSION: 1. Acute pulmonary emboli within the left main pulmonary artery  extending to the left interlobar artery and the upper and lower lobe segmental branches. Acute pulmonary emboli also noted in the right lower lobe segmental branches. 2. Peripheral predominant wedge-shaped opacities and patchy ground-glass changes within the posterior left lower lobe are concerning for infarction. An underlying infectious/inflammatory process can not be excluded. 3. Small left pleural effusion. These results were called by telephone at the time of interpretation on 08/11/2023 at 4:07 pm to provider Meridee Score, MD, who verbally acknowledged these results. Electronically Signed   By: Hart Robinsons M.D.   On: 08/11/2023 16:23   DG Chest 2 View  Result Date: 08/10/2023 CLINICAL DATA:  Chest pain. EXAM: CHEST - 2 VIEW COMPARISON:  None Available. FINDINGS: There is an approximately 3.5 x 5.5 cm opacity overlying the left lateral costophrenic angle with associated small left pleural effusion.  Follow-up to clearing is recommended. Bilateral lung fields are otherwise clear. Right costophrenic angle is clear. Normal cardio-mediastinal silhouette. No acute osseous abnormalities. The soft tissues are within normal limits. IMPRESSION: *Small left lung lower lobe pneumonia with associated small left pleural effusion. Follow-up to clearing is recommended. Electronically Signed   By: Jules Schick M.D.   On: 08/10/2023 13:39    Procedures .Critical Care  Performed by: Ma Rings, PA-C Authorized by: Ma Rings, PA-C   Critical care provider statement:    Critical care time (minutes):  30   Critical care was necessary to treat or prevent imminent or life-threatening deterioration of the following conditions:  Respiratory failure and shock   Critical care was time spent personally by me on the following activities:  Development of treatment plan with patient or surrogate, discussions with consultants, evaluation of patient's response to treatment, examination of patient, ordering  and review of laboratory studies, ordering and review of radiographic studies, ordering and performing treatments and interventions, pulse oximetry, re-evaluation of patient's condition and review of old charts   I assumed direction of critical care for this patient from another provider in my specialty: no     Care discussed with: admitting provider     Care discussed with comment:  Dr. Mariea Clonts, Dr. Judeth Horn     Medications Ordered in ED Medications  heparin bolus via infusion 4,000 Units (4,000 Units Intravenous Bolus from Bag 08/11/23 1636)    Followed by  heparin ADULT infusion 100 units/mL (25000 units/250mL) (1,250 Units/hr Intravenous New Bag/Given 08/11/23 1644)  iohexol (OMNIPAQUE) 350 MG/ML injection 75 mL (75 mLs Intravenous Contrast Given 08/11/23 1539)    ED Course/ Medical Decision Making/ A&P                                 Medical Decision Making This patient presents to the ED for concern of mopped assist, chest pain, this involves an extensive number of treatment options, and is a complaint that carries with it a high risk of complications and morbidity.  The differential diagnosis includes pneumonia, pulmonary embolism, malignancy, hematemesis, other   Co morbidities that complicate the patient evaluation :   tobacco abuse   Additional history obtained:  Additional history obtained from EMR External records from outside source obtained and reviewed including notes, labs, xray   Lab Tests:  I Ordered, and personally interpreted labs.  The pertinent results include: Patient has mild anemia, normal renal function, leukocytosis 15.2   Imaging Studies ordered:  I ordered imaging studies including CTA chest which shows filling defect in left main pulmonary artery and I independently visualized and interpreted imaging within scope of identifying emergent findings  I agree with the radiologist interpretation-radiologist notes acute pulmonary emboli within the left  main pulmonary artery extending to the left interlobar artery and left upper lower lobe segmental branches also acute pulmonary emboli in the right lower lobe segmental branches, noted peripheral predominantly wedge-shaped opacities and patchy groundglass changes within the posterior left lobe are concerning for infarction but cannot rule out underlying inflammation or infection   Cardiac Monitoring: / EKG:  The patient was maintained on a cardiac monitor.  I personally viewed and interpreted the cardiac monitored which showed an underlying rhythm of: Sinus rhythm   Consultations Obtained:  I requested consultation with the last, Dr. Mariea Clonts,  and discussed lab and imaging findings as well as pertinent plan - they recommend: Patient  with pulmonology to see if they feels appropriate for patient to stay at [facility] since he has been having some frank hemoptysis or if they would prefer him to be admitted to [facility]  Consult with Dr. Judeth Horn is on-call for pulmonary medicine critical care.  He feels patient is low risk to stay here at [facility] primarily just observe for hemoptysis and can then transition to oral anticoagulants and discharge home.     Problem List / ED Course / Critical interventions / Medication management  Presented with hemoptysis, seen yesterday for fever, cough and pleuritic chest pain, had infiltrate on chest x-ray leukocytosis, was given medications to treat with antibiotics.  This morning he noticed he was coughing up a lot of blood and came to the ER for further evaluation.  He states the hydrocodone is controlling his pain but was concerned about the hemoptysis.  No leg swelling, no hypoxia, is not tachycardic today.  CT ordered due to concern for possible PE with pulmonary infarct versus malignancy as a cause of his hemoptysis.  Radiology called with critical finding of multiple PEs with consolidation concerning for possible infarct.  Patient is not tachycardic no  right heart strain noted on EKG. at this time patient has not had any hemoptysis here in the ER COVID care feels patient is stable to stay here at [facility].  Agreeable with admission and heparin to see how he does with the blood thinners given his hemoptysis. I ordered medication including heparin  for PE  Reevaluation of the patient after these medicines showed that the patient stayed the same I have reviewed the patients home medicines and have made adjustments as needed       Amount and/or Complexity of Data Reviewed Radiology: ordered.  Risk Prescription drug management.           Final Clinical Impression(s) / ED Diagnoses Final diagnoses:  Acute pulmonary embolism, unspecified pulmonary embolism type, unspecified whether acute cor pulmonale present [facility])    Rx / DC Orders ED Discharge Orders     None         Josem Kaufmann 08/11/23 1713    Terrilee Files, MD 08/12/23 1053

## 2023-08-11 NOTE — Assessment & Plan Note (Addendum)
Appears to be unprovoked PE.  Presenting with hemoptysis, fevers, chest pain, difficulty breathing, cough pains without swelling.  So far no risk factors identified.  Stable vitals, not tachycardic, blood pressure stable. Tobacco abuse. Not on any medications.   CTA chest shows acute PE and left main pulmonary artery, extending to the left interlobar artery, segmental branches, wedge-shaped opacities within posterior left lower lobe concerning for infarction. -IV heparin - EDP talked to pulmonologist Dr. Judeth Horn, low risk PE with hemoptysis, okay to stay here at [facility], heparin, observe, once hemoptysis improves consider discharge. - ECHO - Bilateral LE venous dopplers -Per CTA - infectious/inflammatory process not excluded, reported fevers which may be from infarct and PE, but also with leukocytosis of 15.2 -Check pro-calcitonin -Continue IV ceftriaxone and azithromycin for now

## 2023-08-11 NOTE — ED Notes (Signed)
Attempted to call for SBAR handoff with no answer

## 2023-08-11 NOTE — ED Notes (Signed)
Report called  

## 2023-08-11 NOTE — ED Triage Notes (Signed)
Pt presents with hematemesis, per pt diagnosed with PNU at this ED on yesterday and started coughing up blood early this am.

## 2023-08-12 ENCOUNTER — Inpatient Hospital Stay (HOSPITAL_COMMUNITY): Payer: Medicaid Other

## 2023-08-12 ENCOUNTER — Other Ambulatory Visit (HOSPITAL_COMMUNITY): Payer: Self-pay

## 2023-08-12 ENCOUNTER — Other Ambulatory Visit (HOSPITAL_COMMUNITY): Payer: Self-pay | Admitting: *Deleted

## 2023-08-12 DIAGNOSIS — I2609 Other pulmonary embolism with acute cor pulmonale: Secondary | ICD-10-CM | POA: Diagnosis not present

## 2023-08-12 DIAGNOSIS — I2699 Other pulmonary embolism without acute cor pulmonale: Secondary | ICD-10-CM | POA: Diagnosis not present

## 2023-08-12 LAB — ECHOCARDIOGRAM COMPLETE
AR max vel: 2.23 cm2
AV Area VTI: 1.99 cm2
AV Area mean vel: 2.07 cm2
AV Mean grad: 5 mm[Hg]
AV Peak grad: 8.3 mm[Hg]
Ao pk vel: 1.44 m/s
Area-P 1/2: 3.85 cm2
Height: 72 in
S' Lateral: 2.9 cm
Weight: 2631.41 [oz_av]

## 2023-08-12 LAB — HEPARIN LEVEL (UNFRACTIONATED)
Heparin Unfractionated: <0.1 [IU]/mL — ABNORMAL LOW (ref 0.30–0.70)
Heparin Unfractionated: <0.1 [IU]/mL — ABNORMAL LOW (ref 0.30–0.70)

## 2023-08-12 LAB — CBC
HCT: 32.2 % — ABNORMAL LOW (ref 39.0–52.0)
Hemoglobin: 11 g/dL — ABNORMAL LOW (ref 13.0–17.0)
MCH: 32.7 pg (ref 26.0–34.0)
MCHC: 34.2 g/dL (ref 30.0–36.0)
MCV: 95.8 fL (ref 80.0–100.0)
Platelets: 234 10*3/uL (ref 150–400)
RBC: 3.36 MIL/uL — ABNORMAL LOW (ref 4.22–5.81)
RDW: 12.7 % (ref 11.5–15.5)
WBC: 13.8 10*3/uL — ABNORMAL HIGH (ref 4.0–10.5)
nRBC: 0 % (ref 0.0–0.2)

## 2023-08-12 LAB — BASIC METABOLIC PANEL
Anion gap: 9 (ref 5–15)
BUN: <5 mg/dL — ABNORMAL LOW (ref 6–20)
CO2: 23 mmol/L (ref 22–32)
Calcium: 8.1 mg/dL — ABNORMAL LOW (ref 8.9–10.3)
Chloride: 100 mmol/L (ref 98–111)
Creatinine, Ser: 0.74 mg/dL (ref 0.61–1.24)
GFR, Estimated: >60 mL/min (ref >60)
Glucose, Bld: 140 mg/dL — ABNORMAL HIGH (ref 70–99)
Potassium: 3.6 mmol/L (ref 3.5–5.1)
Sodium: 132 mmol/L — ABNORMAL LOW (ref 135–145)

## 2023-08-12 LAB — HIV ANTIBODY (ROUTINE TESTING W REFLEX): HIV Screen 4th Generation wRfx: NONREACTIVE

## 2023-08-12 MED ORDER — HEPARIN BOLUS VIA INFUSION
2000.0000 [IU] | Freq: Once | INTRAVENOUS | Status: AC
  Administered 2023-08-12: 2000 [IU] via INTRAVENOUS
  Filled 2023-08-12: qty 2000

## 2023-08-12 MED ORDER — DEXTROSE 5 % IV SOLN
500.0000 mg | INTRAVENOUS | Status: DC
  Administered 2023-08-12 – 2023-08-13 (×2): 500 mg via INTRAVENOUS
  Filled 2023-08-12 (×2): qty 5

## 2023-08-12 MED ORDER — PERFLUTREN LIPID MICROSPHERE
1.0000 mL | INTRAVENOUS | Status: AC | PRN
  Administered 2023-08-12: 4 mL via INTRAVENOUS

## 2023-08-12 MED ORDER — ENOXAPARIN SODIUM 80 MG/0.8ML IJ SOSY
1.0000 mg/kg | PREFILLED_SYRINGE | Freq: Two times a day (BID) | INTRAMUSCULAR | Status: DC
  Administered 2023-08-12 – 2023-08-14 (×4): 75 mg via SUBCUTANEOUS
  Filled 2023-08-12 (×4): qty 0.8

## 2023-08-12 NOTE — Progress Notes (Addendum)
PROGRESS NOTE    Glenn Oconnor  IHK:[id number] DOB: 06-16-1976 DOA: 08/11/2023 PCP: Pcp, No    Brief Narrative:  Glenn Oconnor is a 47 y.o. male with medical history significant for tobacco use.  Patient presented to the ED with complaints of difficulty breathing and coughing up blood.  Found to have an acute PE and possible PNA.     Assessment and Plan: * Acute pulmonary embolism (HCC) Appears to be unprovoked PE.  Presenting with hemoptysis, fevers, chest pain, difficulty breathing, cough pains without swelling.  So far no risk factors identified.  Stable vitals, not tachycardic, blood pressure stable. Tobacco abuse. Not on any medications.   CTA chest shows acute PE and left main pulmonary artery, extending to the left interlobar artery, segmental branches, wedge-shaped opacities within posterior left lower lobe concerning for infarction. -IV heparin until hemoptysis stops - EDP talked to pulmonologist Dr. Judeth Horn, low risk PE with hemoptysis, okay to stay here at [facility], heparin, observe, once hemoptysis improves consider discharge. - ECHO - Bilateral LE venous dopplers -Per CTA - infectious/inflammatory process not excluded, reported fevers which may be from infarct and PE, but also with leukocytosis of 15.2 -Continue IV ceftriaxone and azithromycin for now -[facility] consult for xarelto vs eliquis If needs outpatient follow up wants to go to [facility]  Tobacco abuse Smokes half a pack of cigarettes daily.      DVT prophylaxis:     Code Status: Full Code   Disposition Plan:  Level of care: Stepdown Status is: Inpatient Remains inpatient appropriate    Consultants:  PCCM (phone only)   Subjective: Overall feeling better  Objective: Vitals:   08/12/23 0700 08/12/23 0800 08/12/23 0900 08/12/23 1000  BP: 113/67 119/72 135/68 118/68  Pulse: 89 83 94 93  Resp: 13 (!) 28 (!) 24 20  Temp:      TempSrc:      SpO2: 98% 95% 99% 98%  Weight:      Height:         Intake/Output Summary (Last 24 hours) at 08/12/2023 1051 Last data filed at 08/12/2023 0650 Gross per 24 hour  Intake 720.21 ml  Output 550 ml  Net 170.21 ml   Filed Weights   08/11/23 1151 08/11/23 1834  Weight: 77.1 kg 74.6 kg    Examination:   General: Appearance:    Well developed, well nourished male in no acute distress     Lungs:     Clear to auscultation bilaterally, respirations unlabored  Heart:    Normal heart rate. Normal rhythm. No murmurs, rubs, or gallops.    MS:   All extremities are intact.    Neurologic:   Awake, alert, oriented x 3. No apparent focal neurological           defect.        Data Reviewed: I have personally reviewed following labs and imaging studies  CBC: Recent Labs  Lab 08/10/23 1232 08/11/23 1218 08/12/23 0650  WBC 14.5* 15.2* 13.8*  HGB 13.6 12.0* 11.0*  HCT 41.0 35.5* 32.2*  MCV 98.6 96.7 95.8  PLT 263 265 234   Basic Metabolic Panel: Recent Labs  Lab 08/10/23 1232 08/11/23 1218 08/12/23 0650  NA 133* 133* 132*  K 3.8 3.5 3.6  CL 97* 100 100  CO2 26 24 23   GLUCOSE 199* 151* 140*  BUN 6 5* <5*  CREATININE 0.78 0.65 0.74  CALCIUM 8.5* 8.4* 8.1*   GFR: Estimated Creatinine Clearance: 123 mL/min (by C-G formula  based on SCr of 0.74 mg/dL). Liver Function Tests: Recent Labs  Lab 08/11/23 1218  AST 18  ALT 14  ALKPHOS 85  BILITOT 1.0  PROT 7.8  ALBUMIN 2.9*   No results for input(s): "LIPASE", "AMYLASE" in the last 168 hours. No results for input(s): "AMMONIA" in the last 168 hours. Coagulation Profile: Recent Labs  Lab 08/10/23 1232  INR 1.1   Cardiac Enzymes: No results for input(s): "CKTOTAL", "CKMB", "CKMBINDEX", "TROPONINI" in the last 168 hours. BNP (last 3 results) No results for input(s): "PROBNP" in the last 8760 hours. HbA1C: No results for input(s): "HGBA1C" in the last 72 hours. CBG: No results for input(s): "GLUCAP" in the last 168 hours. Lipid Profile: No results for input(s):  "CHOL", "HDL", "LDLCALC", "TRIG", "CHOLHDL", "LDLDIRECT" in the last 72 hours. Thyroid Function Tests: No results for input(s): "TSH", "T4TOTAL", "FREET4", "T3FREE", "THYROIDAB" in the last 72 hours. Anemia Panel: No results for input(s): "VITAMINB12", "FOLATE", "FERRITIN", "TIBC", "IRON", "RETICCTPCT" in the last 72 hours. Sepsis Labs: Recent Labs  Lab 08/10/23 1232 08/10/23 1425 08/11/23 1218  PROCALCITON  --   --  0.16  LATICACIDVEN 1.1 0.8  --     Recent Results (from the past 240 hour(s))  Resp panel by RT-PCR (RSV, Flu A&B, Covid) Anterior Nasal Swab     Status: None   Collection Time: 08/10/23 12:13 PM   Specimen: Anterior Nasal Swab  Result Value Ref Range Status   SARS Coronavirus 2 by RT PCR NEGATIVE NEGATIVE Final    Comment: (NOTE) SARS-CoV-2 target nucleic acids are NOT DETECTED.  The SARS-CoV-2 RNA is generally detectable in upper respiratory specimens during the acute phase of infection. The lowest concentration of SARS-CoV-2 viral copies this assay can detect is 138 copies/mL. A negative result does not preclude SARS-Cov-2 infection and should not be used as the sole basis for treatment or other patient management decisions. A negative result may occur with  improper specimen collection/handling, submission of specimen other than nasopharyngeal swab, presence of viral mutation(s) within the areas targeted by this assay, and inadequate number of viral copies(<138 copies/mL). A negative result must be combined with clinical observations, patient history, and epidemiological information. The expected result is Negative.  Fact Sheet for Patients:  BloggerCourse.com  Fact Sheet for Healthcare Providers:  SeriousBroker.it  This test is no t yet approved or cleared by the [place] FDA and  has been authorized for detection and/or diagnosis of SARS-CoV-2 by FDA under an Emergency Use Authorization (EUA). This  EUA will remain  in effect (meaning this test can be used) for the duration of the COVID-19 declaration under Section 564(b)(1) of the Act, 21 U.S.C.section 360bbb-3(b)(1), unless the authorization is terminated  or revoked sooner.       Influenza A by PCR NEGATIVE NEGATIVE Final   Influenza B by PCR NEGATIVE NEGATIVE Final    Comment: (NOTE) The Xpert Xpress SARS-CoV-2/FLU/RSV plus assay is intended as an aid in the diagnosis of influenza from Nasopharyngeal swab specimens and should not be used as a sole basis for treatment. Nasal washings and aspirates are unacceptable for Xpert Xpress SARS-CoV-2/FLU/RSV testing.  Fact Sheet for Patients: BloggerCourse.com  Fact Sheet for Healthcare Providers: SeriousBroker.it  This test is not yet approved or cleared by the [place] FDA and has been authorized for detection and/or diagnosis of SARS-CoV-2 by FDA under an Emergency Use Authorization (EUA). This EUA will remain in effect (meaning this test can be used) for the duration of the COVID-19 declaration  under Section 564(b)(1) of the Act, 21 U.S.C. section 360bbb-3(b)(1), unless the authorization is terminated or revoked.     Resp Syncytial Virus by PCR NEGATIVE NEGATIVE Final    Comment: (NOTE) Fact Sheet for Patients: BloggerCourse.com  Fact Sheet for Healthcare Providers: SeriousBroker.it  This test is not yet approved or cleared by the [place] FDA and has been authorized for detection and/or diagnosis of SARS-CoV-2 by FDA under an Emergency Use Authorization (EUA). This EUA will remain in effect (meaning this test can be used) for the duration of the COVID-19 declaration under Section 564(b)(1) of the Act, 21 U.S.C. section 360bbb-3(b)(1), unless the authorization is terminated or revoked.  Performed at [facility], [address] [place], [place] [address]    Blood Culture (routine x 2)     Status: None (Preliminary result)   Collection Time: 08/10/23  1:10 PM   Specimen: BLOOD  Result Value Ref Range Status   Specimen Description   Final    BLOOD RFOA Performed at [facility], [address], [place], [place] [address]    Special Requests   Final    Blood Culture results may not be optimal due to an excessive volume of blood received in culture bottles BOTTLES DRAWN AEROBIC AND ANAEROBIC Performed at [facility], [address], [place], [place] [address]    Culture   Final    NO GROWTH 2 DAYS Performed at [facility], [address] [place], [place] [address]    Report Status PENDING  Incomplete  Blood Culture (routine x 2)     Status: None (Preliminary result)   Collection Time: 08/10/23  1:10 PM   Specimen: BLOOD  Result Value Ref Range Status   Specimen Description   Final    BLOOD BLOOD LEFT ARM Performed at [facility], [address], [place], [place] [address]    Special Requests   Final    BOTTLES DRAWN AEROBIC AND ANAEROBIC Blood Culture results may not be optimal due to an excessive volume of blood received in culture bottles Performed at [facility], [address], [place], [place] [address]    Culture   Final    NO GROWTH 2 DAYS Performed at [facility], [address], [place], [place] [address]    Report Status PENDING  Incomplete  MRSA Next Gen by PCR, Nasal     Status: None   Collection Time: 08/11/23  6:21 PM   Specimen: Nasal Mucosa; Nasal Swab  Result Value Ref Range Status   MRSA by PCR Next Gen NOT DETECTED NOT DETECTED Final    Comment: (NOTE) The GeneXpert MRSA Assay (FDA approved for NASAL specimens only), is one component of a comprehensive MRSA colonization surveillance program. It is not intended to diagnose MRSA infection nor to guide or monitor treatment for MRSA infections. Test performance is not FDA approved in patients less than 87 years old. Performed at [facility], [address], [place], [place] [address]          Radiology Studies: CT Angio Chest PE W and/or Wo Contrast  Result Date: 08/11/2023 CLINICAL DATA:  Pulmonary embolism (PE) suspected, high prob. Hematemesis. EXAM: CT ANGIOGRAPHY CHEST WITH CONTRAST TECHNIQUE: Multidetector CT imaging of the chest was performed using the standard protocol during bolus administration of intravenous contrast. Multiplanar CT image reconstructions and MIPs were obtained to evaluate the vascular anatomy. RADIATION DOSE REDUCTION: This exam was performed according to the departmental dose-optimization program which includes automated  exposure control, adjustment of the mA and/or kV according to patient size and/or use of iterative reconstruction technique. CONTRAST:  75mL OMNIPAQUE IOHEXOL 350 MG/ML SOLN COMPARISON:  None Available. FINDINGS: Cardiovascular: Satisfactory opacification of the pulmonary arteries to the segmental level. Intraluminal filling defect compatible with acute pulmonary embolism within the left main pulmonary artery, at the bifurcation of the left upper lobe segmental branches and left interlobar artery, with filling defects extending into the posterior left upper lobe segmental branches, throughout the left interlobar artery and distally into multiple left lower lobe segmental branches. Intraluminal filling defect is also seen within the right lower lobe segmental branches. Normal heart size. No definite evidence of right heart strain. No pericardial effusion. Mediastinum/Nodes: No enlarged mediastinal, hilar, or axillary lymph nodes. Thyroid gland, trachea, and esophagus demonstrate no significant findings. Lungs/Pleura: Small left pleural effusion with basilar atelectasis. Patchy ground-glass changes with peripheral predominant wedge-shaped opacities within the posterior left lower lobe. Linear atelectasis at the right lung base. No pneumothorax. Upper Abdomen: No acute abnormality.  Musculoskeletal: No acute osseous abnormality. Review of the MIP images confirms the above findings. IMPRESSION: 1. Acute pulmonary emboli within the left main pulmonary artery extending to the left interlobar artery and the upper and lower lobe segmental branches. Acute pulmonary emboli also noted in the right lower lobe segmental branches. 2. Peripheral predominant wedge-shaped opacities and patchy ground-glass changes within the posterior left lower lobe are concerning for infarction. An underlying infectious/inflammatory process can not be excluded. 3. Small left pleural effusion. These results were called by telephone at the time of interpretation on 08/11/2023 at 4:07 pm to provider Meridee Score, MD, who verbally acknowledged these results. Electronically Signed   By: Hart Robinsons M.D.   On: 08/11/2023 16:23   DG Chest 2 View  Result Date: 08/10/2023 CLINICAL DATA:  Chest pain. EXAM: CHEST - 2 VIEW COMPARISON:  None Available. FINDINGS: There is an approximately 3.5 x 5.5 cm opacity overlying the left lateral costophrenic angle with associated small left pleural effusion. Follow-up to clearing is recommended. Bilateral lung fields are otherwise clear. Right costophrenic angle is clear. Normal cardio-mediastinal silhouette. No acute osseous abnormalities. The soft tissues are within normal limits. IMPRESSION: *Small left lung lower lobe pneumonia with associated small left pleural effusion. Follow-up to clearing is recommended. Electronically Signed   By: Jules Schick M.D.   On: 08/10/2023 13:39        Scheduled Meds:  Chlorhexidine Gluconate Cloth  6 each Topical Q0600   guaiFENesin-dextromethorphan  15 mL Oral Q8H   Continuous Infusions:  azithromycin Stopped (08/11/23 2247)   cefTRIAXone (ROCEPHIN)  IV Stopped (08/11/23 2103)   heparin Stopped (08/12/23 1047)     LOS: 1 day    Time spent: 45 minutes spent on chart review, discussion with nursing staff, consultants, updating  family and interview/physical exam; more than 50% of that time was spent in counseling and/or coordination of care.    Joseph Art, DO [facility] Available via Epic secure chat 7am-7pm After these hours, please refer to coverage provider listed on [email or web address] 08/12/2023, 10:51 AM

## 2023-08-12 NOTE — Progress Notes (Signed)
PHARMACY - ANTICOAGULATION CONSULT NOTE  Pharmacy Consult for Heparin Indication: pulmonary embolus  No Known Allergies  Patient Measurements: Height: 6' (182.9 cm) Weight: 74.6 kg (164 lb 7.4 oz) IBW/kg (Calculated) : 77.6 HEPARIN DW (KG): 74.6   Vital Signs: Temp: 99.3 F (37.4 C) (10/30 2342) Temp Source: Oral (10/30 2342) BP: 114/60 (10/31 0400) Pulse Rate: 76 (10/31 0200)  Labs: Recent Labs    08/10/23 1232 08/11/23 1218 08/11/23 2218 08/12/23 0650  HGB 13.6 12.0*  --  11.0*  HCT 41.0 35.5*  --  32.2*  PLT 263 265  --  234  APTT 30  --   --   --   LABPROT 14.7  --   --   --   INR 1.1  --   --   --   HEPARINUNFRC  --   --  <0.10* <0.10*  CREATININE 0.78 0.65  --  0.74  TROPONINIHS 2  --   --   --     Estimated Creatinine Clearance: 123 mL/min (by C-G formula based on SCr of 0.74 mg/dL).   Medical History: History reviewed. No pertinent past medical history.  Medications:  See med rec  Assessment: Patient is 47 yo male who presented yesterday with SOB and has LEFT Flank pain x3 and fever. Today patient presents with hemoptysis. Patient is not on oral anticoagulants. CTAngiogram is + Pulmonary Embolus. Pharmacy asked to start heparin.   HL <0.1, subtherapeutic. No issues with infusion per RN  Goal of Therapy:  Heparin level 0.3-0.7 units/ml Monitor platelets by anticoagulation protocol: Yes   Plan:  Give 2000 units bolus x 1 Increase heparin infusion to 1600 units/hr Check anti-Xa level in ~6 hours and daily while on heparin Continue to monitor H&H and platelets  Elder Cyphers, BS Pharm D, BCPS Clinical Pharmacist 08/12/2023,7:50 AM

## 2023-08-12 NOTE — Discharge Instructions (Addendum)
1)Avoid ibuprofen/Advil/Aleve/Motrin/Goody Powders/Naproxen/BC powders/Meloxicam/Diclofenac/Indomethacin and other Nonsteroidal anti-inflammatory medications as these will make you more likely to bleed and can cause stomach ulcers, can also cause Kidney problems.   2)Please take Eliquis/apixaban which is a blood thinner as prescribed to prevent further blood clots--- you will need to be on blood thinners for at least 6 months may be lifelong  3)Avoid running, jogging or other strenuous activity until you follow-up with primary care physician around Monday, 08/23/2023------ you can go about your regular activities like you did previously       Providers Accepting New Patients in [place], [place]    [facility] [address]  [place], [place] [address] [phone] Accepts most insurances  [facility] [address] [place], [place] [address] [phone] Accepts most insurances  [facility] [address] [place], [place] [address]  [phone] Must meet requirements  [facility] [address] [place], [place] [address] [phone] Accepts most insurances  [facility] [address]  [place], [place] [address] [phone] Accepts most insurances  [facility] [address]   [place], [place]   [phone] Accepts most insurances  [facility])  [phone] [address]  [place], [place] [address] [phone] Accepts most insurances     [facility] [address]  [place], [place] [address] [phone] Accepts most insurances  [facility] [address] [place], [place] [address] [phone] option 1 Accepts Medicaid and [facility] [address]  [place], [place] [address] [phone] Accepts most insurances  Avon Gully, MD [address] [place], [place] [address] [phone] Accepts most insurances  [facility] [address]  [place], [place] [address] [phone] Accepts most insurances  [facility] [phone] [address] [place], [place] [address] [phone] Accepts most insurances  [place] [place] [address] [place], [place] [address] [phone]  Accepts most insurances

## 2023-08-12 NOTE — Progress Notes (Signed)
Ultrasound called regarding Glenn Oconnor venous ultrasound done today. Two thrombus were found. One in the left popliteal and another in the left posterior tibial vein. MD and primary RN made aware. Awaiting orders.

## 2023-08-12 NOTE — Progress Notes (Addendum)
PHARMACY - ANTICOAGULATION CONSULT NOTE  Pharmacy Consult for Heparin=> lovenox Indication: pulmonary embolus  No Known Allergies  Patient Measurements: Height: 6' (182.9 cm) Weight: 74.6 kg (164 lb 7.4 oz) IBW/kg (Calculated) : 77.6 HEPARIN DW (KG): 74.6   Vital Signs: Temp: 98.9 F (37.2 C) (10/31 1600) Temp Source: Oral (10/31 1600) BP: 122/76 (10/31 1600) Pulse Rate: 96 (10/31 1600)  Labs: Recent Labs    08/10/23 1232 08/11/23 1218 08/11/23 2218 08/12/23 0650 08/12/23 1341  HGB 13.6 12.0*  --  11.0*  --   HCT 41.0 35.5*  --  32.2*  --   PLT 263 265  --  234  --   APTT 30  --   --   --   --   LABPROT 14.7  --   --   --   --   INR 1.1  --   --   --   --   HEPARINUNFRC  --   --  <0.10* <0.10* <0.10*  CREATININE 0.78 0.65  --  0.74  --   TROPONINIHS 2  --   --   --   --     Estimated Creatinine Clearance: 123 mL/min (by C-G formula based on SCr of 0.74 mg/dL).   Medical History: History reviewed. No pertinent past medical history.  Medications:  See med rec  Assessment: Patient is 47 yo male who presented yesterday with SOB and has LEFT Flank pain x3 and fever. Today patient presents with hemoptysis. Patient is not on oral anticoagulants. CTAngiogram is + Pulmonary Embolus. Pharmacy asked to start heparin.  Reviewed with MD and will switch patient to lovenox for PE  Goal of Therapy:  Heparin level 0.3-0.7 units/ml Monitor platelets by anticoagulation protocol: Yes   Plan:  D/c heparin Lovenox 1mg /kg(75mg ) sq q12h F/u transition to oral tx Continue to monitor H&H and platelets  Elder Cyphers, BS Pharm D, BCPS Clinical Pharmacist 08/12/2023,4:57 PM

## 2023-08-12 NOTE — TOC CM/SW Note (Signed)
Transition of Care [facility]) - Inpatient Brief Assessment   Patient Details  Name: Glenn Oconnor MRN: [id number] Date of Birth: 1976/05/05  Transition of Care [facility]) CM/SW Contact:    Villa Herb, LCSWA Phone Number: 08/12/2023, 9:12 AM   Clinical Narrative: Transition of Care Department [facility]) has reviewed patient and no TOC needs have been identified at this time. We will continue to monitor patient advancement through interdisciplinary progression rounds. If new patient transition needs arise, please place a TOC consult.  Transition of Care Asessment: Insurance and Status: Insurance coverage has been reviewed Patient has primary care physician: No (PCP list added to AVS for pt to review) Home environment has been reviewed: from home Prior level of function:: independent Prior/Current Home Services: No current home services Social Determinants of Health Reivew: SDOH reviewed no interventions necessary Readmission risk has been reviewed: Yes Transition of care needs: no transition of care needs at this time

## 2023-08-12 NOTE — Progress Notes (Signed)
*  PRELIMINARY RESULTS* Echocardiogram 2D Echocardiogram has been performed with Definity.  Stacey Drain 08/12/2023, 3:18 PM

## 2023-08-12 NOTE — TOC Benefit Eligibility Note (Signed)
Patient Product/process development scientist completed.    The patient is insured through [employer].     Ran test claim for Eliquis 5 mg and the current 30 day co-pay is $4.00.   This test claim was processed through [facility]- copay amounts may vary at other pharmacies due to pharmacy/plan contracts, or as the patient moves through the different stages of their insurance plan.     Roland Earl, CPHT Pharmacy Technician III Certified Patient Advocate [facility] Patient Advocate Team Direct Number: [phone]  Fax: [phone]

## 2023-08-12 NOTE — Plan of Care (Addendum)
Plan of care reviewed. Pt has been progressing. Pt is stable hemodynamically, on room air, normal respiratory rate and efforts, no shortness of breathe. SPO2 98-99%.  He has occasionally cough with minimal hemoptysis. His max temp last night is 100.3 F. Tylenol was given. He pleuritic chest pain is well tolerated. He is able to rest well. His spouse is at bedside and very supportive.  On Heparin gtt, rate is adjusted by pharmacist. No acute distress noted. We will continue to monitor.  Problem: Clinical Measurements: Goal: Ability to maintain clinical measurements within normal limits will improve Outcome: Progressing Goal: Will remain free from infection Outcome: Progressing Goal: Diagnostic test results will improve Outcome: Progressing Goal: Respiratory complications will improve Outcome: Progressing Goal: Cardiovascular complication will be avoided Outcome: Progressing   Problem: Pain Management: Goal: General experience of comfort will improve Outcome: Progressing   Filiberto Pinks, RN

## 2023-08-12 NOTE — Progress Notes (Signed)
Unable to get heparin  to a therapeutic level.  Will switch over to lovenox

## 2023-08-13 DIAGNOSIS — I2699 Other pulmonary embolism without acute cor pulmonale: Secondary | ICD-10-CM | POA: Diagnosis not present

## 2023-08-13 DIAGNOSIS — J189 Pneumonia, unspecified organism: Secondary | ICD-10-CM | POA: Diagnosis present

## 2023-08-13 DIAGNOSIS — F17210 Nicotine dependence, cigarettes, uncomplicated: Secondary | ICD-10-CM

## 2023-08-13 DIAGNOSIS — I82409 Acute embolism and thrombosis of unspecified deep veins of unspecified lower extremity: Secondary | ICD-10-CM | POA: Diagnosis present

## 2023-08-13 DIAGNOSIS — Z720 Tobacco use: Secondary | ICD-10-CM | POA: Diagnosis not present

## 2023-08-13 LAB — BASIC METABOLIC PANEL
Anion gap: 7 (ref 5–15)
BUN: <5 mg/dL — ABNORMAL LOW (ref 6–20)
CO2: 26 mmol/L (ref 22–32)
Calcium: 8.5 mg/dL — ABNORMAL LOW (ref 8.9–10.3)
Chloride: 102 mmol/L (ref 98–111)
Creatinine, Ser: 0.7 mg/dL (ref 0.61–1.24)
GFR, Estimated: >60 mL/min (ref >60)
Glucose, Bld: 100 mg/dL — ABNORMAL HIGH (ref 70–99)
Potassium: 3.9 mmol/L (ref 3.5–5.1)
Sodium: 135 mmol/L (ref 135–145)

## 2023-08-13 LAB — CBC
HCT: 31.7 % — ABNORMAL LOW (ref 39.0–52.0)
Hemoglobin: 10.3 g/dL — ABNORMAL LOW (ref 13.0–17.0)
MCH: 31.5 pg (ref 26.0–34.0)
MCHC: 32.5 g/dL (ref 30.0–36.0)
MCV: 96.9 fL (ref 80.0–100.0)
Platelets: 277 10*3/uL (ref 150–400)
RBC: 3.27 MIL/uL — ABNORMAL LOW (ref 4.22–5.81)
RDW: 12.4 % (ref 11.5–15.5)
WBC: 11.6 10*3/uL — ABNORMAL HIGH (ref 4.0–10.5)
nRBC: 0 % (ref 0.0–0.2)

## 2023-08-13 MED ORDER — NICOTINE 14 MG/24HR TD PT24
14.0000 mg | MEDICATED_PATCH | Freq: Every day | TRANSDERMAL | Status: DC
  Filled 2023-08-13: qty 1

## 2023-08-13 MED ORDER — OXYCODONE HCL 5 MG PO TABS: 5.0000 mg | ORAL_TABLET | Freq: Once | ORAL | Status: DC

## 2023-08-13 NOTE — Progress Notes (Signed)
Transferred from ICU stable condition     08/13/23 1500  Vitals  Temp 98.1 F (36.7 C)  Temp Source Oral  BP 107/76  ECG Heart Rate 76  Resp 20  Level of Consciousness  Level of Consciousness Alert  MEWS COLOR  MEWS Score Color Green  Oxygen Therapy  SpO2 100 %  O2 Device Room Air  MEWS Score  MEWS Temp 0  MEWS Systolic 0  MEWS Pulse 0  MEWS RR 0  MEWS LOC 0  MEWS Score 0

## 2023-08-13 NOTE — Progress Notes (Addendum)
PROGRESS NOTE  Glenn Oconnor  GNF:[id number] DOB: Dec 01, 1975 DOA: 08/11/2023 PCP: Pcp, No   Brief Narrative:  Glenn Oconnor is a 47 y.o. male with medical history significant for tobacco use.  Patient presented to the ED with complaints of difficulty breathing and coughing up blood.  Found to have an acute PE and possible PNA.    Assessment and Plan: 1)Acute Bil pulmonary embolism and Lt Leg DVT Appears to be unprovoked PE.   -So far no risk factors identified.   CTA chest shows acute PE and left main pulmonary artery, extending to the left interlobar artery, segmental branches, wedge-shaped opacities within posterior left lower lobe concerning for infarction.,  PE also noted in the right lower lobe segmental branches -Unable to achieve therapeutic levels on IV heparin switched to subcu Lovenox on 08/12/2023 - EDP talked to pulmonologist Dr. Judeth Horn, low risk PE with hemoptysis, okay to stay here at [facility],--anticoagulate - ECHO EF 60 to 65%, no regional wall motion normalities, no diastolic dysfunction, no aortic stenosis and no mitral stenosis - Bilateral LE venous dopplers-left popliteal and posterior tibial vein DVT, no DVT on the right -May need lifelong anticoagulation -Recommend PSA test and colonoscopy as outpatient to rule out underlying malignancy - 2)Possible Pneumonia --- fevers or leukocytosis noted  -Per CTA - infectious/inflammatory process not excluded,  -Continue IV ceftriaxone and azithromycin and mucolytics with bronchodilators If needs outpatient follow up wants to go to [facility]  3)Tobacco abuse -Smoking cessation counseling for 4 minutes today,  Give nicotine patch I have discussed tobacco cessation with the patient.  I have counseled the patient regarding the negative impacts of continued tobacco use including but not limited to lung cancer, COPD, and cardiovascular disease.  I have discussed alternatives to tobacco and modalities that may help facilitate tobacco  cessation including but not limited to biofeedback, hypnosis, and medications.  Total time spent with tobacco counseling was 4 minutes.   DVT prophylaxis: On therapeutic Lovenox    Code Status: Full Code  Disposition Plan: Home when medically improved Level of care: Telemetry Status is: Inpatient Remains inpatient appropriate    Consultants:  PCCM (phone only)   Subjective: Mother at bedside -Intermittent chest pains -No significant dyspnea or hypoxemia -Hemoptysis persist -Tmax 100.9 T-current 99.3  Objective: Vitals:   08/13/23 1100 08/13/23 1121 08/13/23 1200 08/13/23 1300  BP: 108/63  (!) 109/54 122/67  Pulse:   84   Resp: 18  (!) 23 18  Temp:  99.3 F (37.4 C)    TempSrc:  Oral    SpO2: 99%  98% 100%  Weight:      Height:        Intake/Output Summary (Last 24 hours) at 08/13/2023 1347 Last data filed at 08/13/2023 0800 Gross per 24 hour  Intake 1290.01 ml  Output --  Net 1290.01 ml   Filed Weights   08/11/23 1151 08/11/23 1834  Weight: 77.1 kg 74.6 kg    Physical Exam  Gen:- Awake Alert, in no acute distress  HEENT:- [place].AT, No sclera icterus Neck-Supple Neck,No JVD,.  Lungs-  CTAB , fair air movement bilaterally  CV- S1, S2 normal, RRR Abd-  +ve B.Sounds, Abd Soft, No tenderness,    Extremity/Skin:- No significant edema, no significant leg tenderness, good pedal pulses  Psych-affect is appropriate, oriented x3 Neuro-no new focal deficits, no tremors   Data Reviewed: I have personally reviewed following labs and imaging studies  CBC: Recent Labs  Lab 08/10/23 1232 08/11/23 1218 08/12/23 0650  08/13/23 0439  WBC 14.5* 15.2* 13.8* 11.6*  HGB 13.6 12.0* 11.0* 10.3*  HCT 41.0 35.5* 32.2* 31.7*  MCV 98.6 96.7 95.8 96.9  PLT 263 265 234 277   Basic Metabolic Panel: Recent Labs  Lab 08/10/23 1232 08/11/23 1218 08/12/23 0650 08/13/23 0439  NA 133* 133* 132* 135  K 3.8 3.5 3.6 3.9  CL 97* 100 100 102  CO2 26 24 23 26   GLUCOSE 199* 151*  140* 100*  BUN 6 5* <5* <5*  CREATININE 0.78 0.65 0.74 0.70  CALCIUM 8.5* 8.4* 8.1* 8.5*   GFR: Estimated Creatinine Clearance: 123 mL/min (by C-G formula based on SCr of 0.7 mg/dL). Liver Function Tests: Recent Labs  Lab 08/11/23 1218  AST 18  ALT 14  ALKPHOS 85  BILITOT 1.0  PROT 7.8  ALBUMIN 2.9*   Coagulation Profile: Recent Labs  Lab 08/10/23 1232  INR 1.1   Sepsis Labs: Recent Labs  Lab 08/10/23 1232 08/10/23 1425 08/11/23 1218  PROCALCITON  --   --  0.16  LATICACIDVEN 1.1 0.8  --     Recent Results (from the past 240 hour(s))  Resp panel by RT-PCR (RSV, Flu A&B, Covid) Anterior Nasal Swab     Status: None   Collection Time: 08/10/23 12:13 PM   Specimen: Anterior Nasal Swab  Result Value Ref Range Status   SARS Coronavirus 2 by RT PCR NEGATIVE NEGATIVE Final    Comment: (NOTE) SARS-CoV-2 target nucleic acids are NOT DETECTED.  The SARS-CoV-2 RNA is generally detectable in upper respiratory specimens during the acute phase of infection. The lowest concentration of SARS-CoV-2 viral copies this assay can detect is 138 copies/mL. A negative result does not preclude SARS-Cov-2 infection and should not be used as the sole basis for treatment or other patient management decisions. A negative result may occur with  improper specimen collection/handling, submission of specimen other than nasopharyngeal swab, presence of viral mutation(s) within the areas targeted by this assay, and inadequate number of viral copies(<138 copies/mL). A negative result must be combined with clinical observations, patient history, and epidemiological information. The expected result is Negative.  Fact Sheet for Patients:  BloggerCourse.com  Fact Sheet for Healthcare Providers:  SeriousBroker.it  This test is no t yet approved or cleared by the [place] FDA and  has been authorized for detection and/or diagnosis of SARS-CoV-2  by FDA under an Emergency Use Authorization (EUA). This EUA will remain  in effect (meaning this test can be used) for the duration of the COVID-19 declaration under Section 564(b)(1) of the Act, 21 U.S.C.section 360bbb-3(b)(1), unless the authorization is terminated  or revoked sooner.       Influenza A by PCR NEGATIVE NEGATIVE Final   Influenza B by PCR NEGATIVE NEGATIVE Final    Comment: (NOTE) The Xpert Xpress SARS-CoV-2/FLU/RSV plus assay is intended as an aid in the diagnosis of influenza from Nasopharyngeal swab specimens and should not be used as a sole basis for treatment. Nasal washings and aspirates are unacceptable for Xpert Xpress SARS-CoV-2/FLU/RSV testing.  Fact Sheet for Patients: BloggerCourse.com  Fact Sheet for Healthcare Providers: SeriousBroker.it  This test is not yet approved or cleared by the [place] FDA and has been authorized for detection and/or diagnosis of SARS-CoV-2 by FDA under an Emergency Use Authorization (EUA). This EUA will remain in effect (meaning this test can be used) for the duration of the COVID-19 declaration under Section 564(b)(1) of the Act, 21 U.S.C. section 360bbb-3(b)(1), unless the authorization is terminated  or revoked.     Resp Syncytial Virus by PCR NEGATIVE NEGATIVE Final    Comment: (NOTE) Fact Sheet for Patients: BloggerCourse.com  Fact Sheet for Healthcare Providers: SeriousBroker.it  This test is not yet approved or cleared by the [place] FDA and has been authorized for detection and/or diagnosis of SARS-CoV-2 by FDA under an Emergency Use Authorization (EUA). This EUA will remain in effect (meaning this test can be used) for the duration of the COVID-19 declaration under Section 564(b)(1) of the Act, 21 U.S.C. section 360bbb-3(b)(1), unless the authorization is terminated or revoked.  Performed at  [facility], [address] [place], [place] [address]   Blood Culture (routine x 2)     Status: None (Preliminary result)   Collection Time: 08/10/23  1:10 PM   Specimen: BLOOD  Result Value Ref Range Status   Specimen Description BLOOD RFOA  Final   Special Requests   Final    Blood Culture results may not be optimal due to an excessive volume of blood received in culture bottles BOTTLES DRAWN AEROBIC AND ANAEROBIC   Culture   Final    NO GROWTH 3 DAYS Performed at [facility], [address] [place], [place] [address]    Report Status PENDING  Incomplete  Blood Culture (routine x 2)     Status: None (Preliminary result)   Collection Time: 08/10/23  1:10 PM   Specimen: BLOOD  Result Value Ref Range Status   Specimen Description BLOOD BLOOD LEFT ARM  Final   Special Requests   Final    BOTTLES DRAWN AEROBIC AND ANAEROBIC Blood Culture results may not be optimal due to an excessive volume of blood received in culture bottles   Culture   Final    NO GROWTH 3 DAYS Performed at [facility], [address] [place], [place] [address]    Report Status PENDING  Incomplete  MRSA Next Gen by PCR, Nasal     Status: None   Collection Time: 08/11/23  6:21 PM   Specimen: Nasal Mucosa; Nasal Swab  Result Value Ref Range Status   MRSA by PCR Next Gen NOT DETECTED NOT DETECTED Final    Comment: (NOTE) The GeneXpert MRSA Assay (FDA approved for NASAL specimens only), is one component of a comprehensive MRSA colonization surveillance program. It is not intended to diagnose MRSA infection nor to guide or monitor treatment for MRSA infections. Test performance is not FDA approved in patients less than 64 years old. Performed at [facility], [address] [place], [place] [address]     Radiology Studies: ECHOCARDIOGRAM COMPLETE  Result Date: 08/12/2023    ECHOCARDIOGRAM REPORT   Patient Name:   DAVONTAY WATLINGTON Date of Exam: 08/12/2023 Medical Rec #:  [id number]        Height:       72.0  in Accession #:    [id number]       Weight:       164.5 lb Date of Birth:  1976/02/14        BSA:          1.961 m Patient Age:    47 years         BP:           118/68 mmHg Patient Gender: M                HR:           93 bpm. [facility] Procedure: 2D  Echo, Cardiac Doppler, Color Doppler and Intracardiac            Opacification Agent Indications:    Pulmonary Embolus I26.09  History:        Patient has no prior history of Echocardiogram examinations.                 Risk Factors:Current Smoker.  Sonographer:    Celesta Gentile RCS Referring Phys: [phone] Heloise Beecham Meher Kucinski IMPRESSIONS  1. Left ventricular ejection fraction, by estimation, is 60 to 65%. The left ventricle has normal function. The left ventricle has no regional wall motion abnormalities. Left ventricular diastolic parameters were normal.  2. Right ventricular systolic function is mild to moderately reduced. The right ventricular size is normal. Tricuspid regurgitation signal is inadequate for assessing PA pressure.  3. The mitral valve is normal in structure. No evidence of mitral valve regurgitation. No evidence of mitral stenosis.  4. The aortic valve is tricuspid. Aortic valve regurgitation is not visualized. No aortic stenosis is present.  5. The inferior vena cava is normal in size with greater than 50% respiratory variability, suggesting right atrial pressure of 3 mmHg. Comparison(s): No prior Echocardiogram. Conclusion(s)/Recommendation(s): No left ventricular mural or apical thrombus/thrombi. FINDINGS  Left Ventricle: Left ventricular ejection fraction, by estimation, is 60 to 65%. The left ventricle has normal function. The left ventricle has no regional wall motion abnormalities. Definity contrast agent was given IV to delineate the left ventricular  endocardial borders. The left ventricular internal cavity size was normal in size. There is no left ventricular hypertrophy. Left ventricular diastolic parameters were normal.  Right Ventricle: The right ventricular size is normal. No increase in right ventricular wall thickness. Right ventricular systolic function is mild to moderately reduced. Tricuspid regurgitation signal is inadequate for assessing PA pressure. Left Atrium: Left atrial size was normal in size. Right Atrium: Right atrial size was normal in size. Pericardium: There is no evidence of pericardial effusion. Mitral Valve: The mitral valve is normal in structure. No evidence of mitral valve regurgitation. No evidence of mitral valve stenosis. Tricuspid Valve: The tricuspid valve is normal in structure. Tricuspid valve regurgitation is trivial. No evidence of tricuspid stenosis. Aortic Valve: The aortic valve is tricuspid. Aortic valve regurgitation is not visualized. No aortic stenosis is present. Aortic valve mean gradient measures 5.0 mmHg. Aortic valve peak gradient measures 8.3 mmHg. Aortic valve area, by VTI measures 1.99 cm. Pulmonic Valve: The pulmonic valve was normal in structure. Pulmonic valve regurgitation is not visualized. No evidence of pulmonic stenosis. Aorta: The aortic root is normal in size and structure. Venous: The inferior vena cava is normal in size with greater than 50% respiratory variability, suggesting right atrial pressure of 3 mmHg. IAS/Shunts: No atrial level shunt detected by color flow Doppler.  LEFT VENTRICLE PLAX 2D LVIDd:         5.00 cm   Diastology LVIDs:         2.90 cm   LV e' medial:    8.05 cm/s LV PW:         0.90 cm   LV E/e' medial:  10.6 LV IVS:        1.00 cm   LV e' lateral:   13.15 cm/s LVOT diam:     1.80 cm   LV E/e' lateral: 6.5 LV SV:         54 LV SV Index:   28 LVOT Area:     2.54 cm  RIGHT VENTRICLE RV S  prime:     15.80 cm/s TAPSE (M-mode): 2.2 cm LEFT ATRIUM             Index        RIGHT ATRIUM           Index LA diam:        3.20 cm 1.63 cm/m   RA Area:     15.20 cm LA Vol (A2C):   31.9 ml 16.27 ml/m  RA Volume:   41.60 ml  21.22 ml/m LA Vol (A4C):   58.7 ml  29.94 ml/m LA Biplane Vol: 49.2 ml 25.09 ml/m  AORTIC VALVE AV Area (Vmax):    2.23 cm AV Area (Vmean):   2.07 cm AV Area (VTI):     1.99 cm AV Vmax:           144.00 cm/s AV Vmean:          103.000 cm/s AV VTI:            0.273 m AV Peak Grad:      8.3 mmHg AV Mean Grad:      5.0 mmHg LVOT Vmax:         126.00 cm/s LVOT Vmean:        83.700 cm/s LVOT VTI:          0.214 m LVOT/AV VTI ratio: 0.78  AORTA Ao Root diam: 3.60 cm MITRAL VALVE MV Area (PHT): 3.85 cm    SHUNTS MV Decel Time: 197 msec    Systemic VTI:  0.21 m MV E velocity: 85.20 cm/s  Systemic Diam: 1.80 cm MV A velocity: 73.30 cm/s MV E/A ratio:  1.16 Vishnu Priya Mallipeddi Electronically signed by Winfield Rast Mallipeddi Signature Date/Time: 08/12/2023/8:52:37 PM    Final    US Venous Img Lower Bilateral (DVT)  Result Date: 08/12/2023 CLINICAL DATA:  History of pulmonary embolus. EXAM: BILATERAL LOWER EXTREMITY VENOUS DOPPLER ULTRASOUND TECHNIQUE: Gray-scale sonography with graded compression, as well as color Doppler and duplex ultrasound were performed to evaluate the lower extremity deep venous systems from the level of the common femoral vein and including the common femoral, femoral, profunda femoral, popliteal and calf veins including the posterior tibial, peroneal and gastrocnemius veins when visible. The superficial great saphenous vein was also interrogated. Spectral Doppler was utilized to evaluate flow at rest and with distal augmentation maneuvers in the common femoral, femoral and popliteal veins. COMPARISON:  August 11, 2023. FINDINGS: RIGHT LOWER EXTREMITY Common Femoral Vein: No evidence of thrombus. Normal compressibility, respiratory phasicity and response to augmentation. Saphenofemoral Junction: No evidence of thrombus. Normal compressibility and flow on color Doppler imaging. Profunda Femoral Vein: No evidence of thrombus. Normal compressibility and flow on color Doppler imaging. Femoral Vein: No evidence of thrombus.  Normal compressibility, respiratory phasicity and response to augmentation. Popliteal Vein: No evidence of thrombus. Normal compressibility, respiratory phasicity and response to augmentation. Calf Veins: No evidence of thrombus. Normal compressibility and flow on color Doppler imaging. Superficial Great Saphenous Vein: No evidence of thrombus. Normal compressibility. Venous Reflux:  None. Other Findings:  None. LEFT LOWER EXTREMITY Common Femoral Vein: No evidence of thrombus. Normal compressibility, respiratory phasicity and response to augmentation. Saphenofemoral Junction: No evidence of thrombus. Normal compressibility and flow on color Doppler imaging. Profunda Femoral Vein: No evidence of thrombus. Normal compressibility and flow on color Doppler imaging. Femoral Vein: No evidence of thrombus. Normal compressibility, respiratory phasicity and response to augmentation. Popliteal Vein: Noncompressible with some flow present consistent with nonocclusive thrombus. Calf  Veins: There is noted occlusive thrombus in the left posterior tibial vein. Superficial Great Saphenous Vein: No evidence of thrombus. Normal compressibility. Venous Reflux:  None. Other Findings:  None. IMPRESSION: Deep venous thrombosis is noted in the left popliteal and posterior tibial veins. No evidence of deep venous thrombosis seen in right lower extremity. These results will be called to the ordering clinician or representative by the Radiologist Assistant, and communication documented in the PACS or zVision Dashboard. Electronically Signed   By: Lupita Raider M.D.   On: 08/12/2023 14:30   CT Angio Chest PE W and/or Wo Contrast  Result Date: 08/11/2023 CLINICAL DATA:  Pulmonary embolism (PE) suspected, high prob. Hematemesis. EXAM: CT ANGIOGRAPHY CHEST WITH CONTRAST TECHNIQUE: Multidetector CT imaging of the chest was performed using the standard protocol during bolus administration of intravenous contrast. Multiplanar CT image  reconstructions and MIPs were obtained to evaluate the vascular anatomy. RADIATION DOSE REDUCTION: This exam was performed according to the departmental dose-optimization program which includes automated exposure control, adjustment of the mA and/or kV according to patient size and/or use of iterative reconstruction technique. CONTRAST:  75mL OMNIPAQUE IOHEXOL 350 MG/ML SOLN COMPARISON:  None Available. FINDINGS: Cardiovascular: Satisfactory opacification of the pulmonary arteries to the segmental level. Intraluminal filling defect compatible with acute pulmonary embolism within the left main pulmonary artery, at the bifurcation of the left upper lobe segmental branches and left interlobar artery, with filling defects extending into the posterior left upper lobe segmental branches, throughout the left interlobar artery and distally into multiple left lower lobe segmental branches. Intraluminal filling defect is also seen within the right lower lobe segmental branches. Normal heart size. No definite evidence of right heart strain. No pericardial effusion. Mediastinum/Nodes: No enlarged mediastinal, hilar, or axillary lymph nodes. Thyroid gland, trachea, and esophagus demonstrate no significant findings. Lungs/Pleura: Small left pleural effusion with basilar atelectasis. Patchy ground-glass changes with peripheral predominant wedge-shaped opacities within the posterior left lower lobe. Linear atelectasis at the right lung base. No pneumothorax. Upper Abdomen: No acute abnormality. Musculoskeletal: No acute osseous abnormality. Review of the MIP images confirms the above findings. IMPRESSION: 1. Acute pulmonary emboli within the left main pulmonary artery extending to the left interlobar artery and the upper and lower lobe segmental branches. Acute pulmonary emboli also noted in the right lower lobe segmental branches. 2. Peripheral predominant wedge-shaped opacities and patchy ground-glass changes within the posterior  left lower lobe are concerning for infarction. An underlying infectious/inflammatory process can not be excluded. 3. Small left pleural effusion. These results were called by telephone at the time of interpretation on 08/11/2023 at 4:07 pm to provider Meridee Score, MD, who verbally acknowledged these results. Electronically Signed   By: Hart Robinsons M.D.   On: 08/11/2023 16:23    Scheduled Meds:  Chlorhexidine Gluconate Cloth  6 each Topical Q0600   enoxaparin (LOVENOX) injection  1 mg/kg Subcutaneous Q12H   oxyCODONE  5 mg Oral Once   Continuous Infusions:  azithromycin 500 mg (08/12/23 2149)   cefTRIAXone (ROCEPHIN)  IV 2 g (08/12/23 2003)    LOS: 2 days   Shon Hale, MD [facility] Available via Epic secure chat 7am-7pm After these hours, please refer to coverage provider listed on [email or web address] 08/13/2023, 1:47 PM

## 2023-08-14 DIAGNOSIS — J189 Pneumonia, unspecified organism: Secondary | ICD-10-CM

## 2023-08-14 DIAGNOSIS — Z720 Tobacco use: Secondary | ICD-10-CM | POA: Diagnosis not present

## 2023-08-14 DIAGNOSIS — I82402 Acute embolism and thrombosis of unspecified deep veins of left lower extremity: Secondary | ICD-10-CM | POA: Diagnosis not present

## 2023-08-14 DIAGNOSIS — I2699 Other pulmonary embolism without acute cor pulmonale: Secondary | ICD-10-CM | POA: Diagnosis not present

## 2023-08-14 LAB — CBC
HCT: 32.7 % — ABNORMAL LOW (ref 39.0–52.0)
Hemoglobin: 10.6 g/dL — ABNORMAL LOW (ref 13.0–17.0)
MCH: 30.9 pg (ref 26.0–34.0)
MCHC: 32.4 g/dL (ref 30.0–36.0)
MCV: 95.3 fL (ref 80.0–100.0)
Platelets: 340 10*3/uL (ref 150–400)
RBC: 3.43 MIL/uL — ABNORMAL LOW (ref 4.22–5.81)
RDW: 12.5 % (ref 11.5–15.5)
WBC: 9.6 10*3/uL (ref 4.0–10.5)
nRBC: 0 % (ref 0.0–0.2)

## 2023-08-14 MED ORDER — DOXYCYCLINE HYCLATE 100 MG PO TABS: 100.0000 mg | ORAL_TABLET | Freq: Two times a day (BID) | ORAL | 0 refills | Status: AC

## 2023-08-14 MED ORDER — CEPHALEXIN 500 MG PO CAPS: 500.0000 mg | ORAL_CAPSULE | Freq: Three times a day (TID) | ORAL | 0 refills | Status: AC

## 2023-08-14 MED ORDER — ACETAMINOPHEN 325 MG PO TABS: 650.0000 mg | ORAL_TABLET | Freq: Four times a day (QID) | ORAL | Status: AC | PRN

## 2023-08-14 MED ORDER — ELIQUIS DVT/PE STARTER PACK 5 MG PO TBPK: ORAL_TABLET | ORAL | 0 refills | Status: AC

## 2023-08-14 MED ORDER — NICOTINE 21 MG/24HR TD PT24: 21.0000 mg | MEDICATED_PATCH | TRANSDERMAL | 0 refills | Status: AC

## 2023-08-14 MED ORDER — APIXABAN 5 MG PO TABS: 5.0000 mg | ORAL_TABLET | Freq: Two times a day (BID) | ORAL | 5 refills | Status: AC

## 2023-08-14 NOTE — Progress Notes (Signed)
patient ambulated to the nurses station and back to room 321. O2 saturation was 100% after ambulating. Patient did not complain of any pain. MD Emokpae notified.

## 2023-08-14 NOTE — Discharge Summary (Signed)
Glenn Oconnor, is a 47 y.o. male  DOB 07-16-1976  MRN [id number].  Admission date:  08/11/2023  Admitting Physician  Onnie Boer, MD  Discharge Date:  08/14/2023   Primary MD  Pcp, No  Recommendations for primary care physician for things to follow:  1)Avoid ibuprofen/Advil/Aleve/Motrin/Goody Powders/Naproxen/BC powders/Meloxicam/Diclofenac/Indomethacin and other Nonsteroidal anti-inflammatory medications as these will make you more likely to bleed and can cause stomach ulcers, can also cause Kidney problems.   2)Please take Eliquis/apixaban which is a blood thinner as prescribed to prevent further blood clots--- you will need to be on blood thinners for at least 6 months may be lifelong  3)Avoid running, jogging or other strenuous activity until you follow-up with primary care physician around Monday, 08/23/2023------ you can go about your regular activities like you did previously  Admission Diagnosis  Acute pulmonary embolism (HCC) [I26.99] Acute pulmonary embolism, unspecified pulmonary embolism type, unspecified whether acute cor pulmonale present (HCC) [I26.99]   Discharge Diagnosis  Acute pulmonary embolism (HCC) [I26.99] Acute pulmonary embolism, unspecified pulmonary embolism type, unspecified whether acute cor pulmonale present (HCC) [I26.99]    Principal Problem:   Acute pulmonary embolism (HCC) Active Problems:   CAP (community acquired pneumonia)   Lt Leg DVT  (Popliteal and Tibia Veins)   Tobacco abuse      History reviewed. No pertinent past medical history.  Past Surgical History:  Procedure Laterality Date   BACK SURGERY         HPI  from the history and physical done on the day of admission:    Chief Complaint: Difficulty Breathing   HPI: Glenn Oconnor is a 47 y.o. male with medical history significant for tobacco use.  Patient presented to the ED with complaints  of difficulty breathing and coughing up blood.   Patient was in the ED yesterday with complaints of difficulty breathing, right sided chest pains, fevers of up to 102 at home.  Chest x-ray done in the ED showed small left lower lobe pneumonia with associated small left pleural effusion, he was given ceftriaxone and azithromycin in the ED, discharged with a prescription for doxycycline, he was started on doxycycline and Norco.   This morning at about 3 AM, he started coughing up blood. He reports previously he had tried not to cough, but after taking the pain medication, he had relief of the chest pain and he was able to cough.  He coughed up blood 4 times, it was dark and thick.  Last time he coughed up blood was 9 AM this morning.  He reports all this started with cramping pain to his bilateral calfs without swelling or redness, that started  about 8 days ago, later moved up to his hip and then he started having chest pain about 5 days ago No personal or family history of blood clots.  He denies previous history of blood clots, no recent trips-lights on long drives, no recent surgeries.  Had back surgery back in February but he has since been ambulatory, and active.  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  No diagnosis of cancer.  Not on any medications.   ED Course: Blood pressure 112-131.  Heart rate 80-98.  Tmax 99.8.  WBC 15.2.  Hemoglobin 12. CTA chest showed acute PE- Acute pulmonary emboli within the left main pulmonary artery extending to the left interlobar artery and the upper and lower lobe segmental branches. Acute pulmonary emboli also noted in the right lower lobe segmental branches. 2. Peripheral predominant wedge-shaped opacities and patchy ground-glass changes within the posterior left lower lobe are concerning for infarction. IV heparin started. EDP talked to Dr. Judeth Horn- recommended, observe, once hemoptysis improves consider discharge.Raelyn Number low risk PE with  hemoptysis.   Review of Systems: As per HPI all other systems reviewed and negative.     Hospital Course:     Brief Narrative:  Glenn Oconnor is a 47 y.o. male with medical history significant for tobacco use.  Patient presented to the ED with complaints of difficulty breathing and coughing up blood.  Found to have an acute PE and possible PNA.     Assessment and Plan: 1)Acute Bil pulmonary embolism and Lt Leg DVT Appears to be unprovoked PE.   -So far no risk factors identified.   CTA chest shows acute PE and left main pulmonary artery, extending to the left interlobar artery, segmental branches, wedge-shaped opacities within posterior left lower lobe concerning for infarction.,  PE also noted in the right lower lobe segmental branches -Unable to achieve therapeutic levels on IV heparin switched to subcu Lovenox on 08/12/2023 - EDP talked to pulmonologist Dr. Judeth Horn, low risk PE with hemoptysis, okay to stay here at [facility],--anticoagulate - ECHO EF 60 to 65%, no regional wall motion normalities, no diastolic dysfunction, no aortic stenosis and no mitral stenosis - Bilateral LE venous dopplers-left popliteal and posterior tibial vein DVT, no DVT on the right -May need lifelong anticoagulation -Recommend PSA test and colonoscopy as outpatient to rule out underlying malignancy - 2)Possible Pneumonia --- fevers or leukocytosis noted  -Per CTA - infectious/inflammatory process not excluded,  -Continue IV ceftriaxone and azithromycin and mucolytics with bronchodilators If needs outpatient follow up wants to go to [facility]   3)Tobacco abuse -Smoking cessation counseling for 4 minutes today,  Give nicotine patch I have discussed tobacco cessation with the patient.  I have counseled the patient regarding the negative impacts of continued tobacco use including but not limited to lung cancer, COPD, and cardiovascular disease.  I have discussed alternatives to tobacco and modalities that may help  facilitate tobacco cessation including but not limited to biofeedback, hypnosis, and medications.  Total time spent with tobacco counseling was 4 minutes.    Discharge Condition: ***  Follow UP     Consults obtained - ***  Diet and Activity recommendation:  As advised  Discharge Instructions    **** Discharge Instructions     Call MD for:  difficulty breathing, headache or visual disturbances   Complete by: As directed    Call MD for:  persistant dizziness or light-headedness   Complete by: As directed    Call MD for:  persistant nausea and vomiting   Complete by: As directed    Call MD for:  temperature >100.4   Complete by: As directed    Diet general   Complete by: As directed    Discharge instructions   Complete by: As directed    1)Avoid ibuprofen/Advil/Aleve/Motrin/Goody Powders/Naproxen/BC powders/Meloxicam/Diclofenac/Indomethacin and other Nonsteroidal  anti-inflammatory medications as these will make you more likely to bleed and can cause stomach ulcers, can also cause Kidney problems.   2)Please take Eliquis/apixaban which is a blood thinner as prescribed to prevent further blood clots--- you will need to be on blood thinners for at least 6 months may be lifelong  3)Avoid running, jogging or other strenuous activity until you follow-up with primary care physician around Monday, 08/23/2023------ you can go about your regular activities like you did previously   Increase activity slowly   Complete by: As directed          Discharge Medications     Allergies as of 08/14/2023   No Known Allergies      Medication List     STOP taking these medications    doxycycline 100 MG capsule Commonly known as: VIBRAMYCIN Replaced by: doxycycline 100 MG tablet   HYDROcodone-acetaminophen 5-325 MG tablet Commonly known as: NORCO/VICODIN   ibuprofen 200 MG tablet Commonly known as: ADVIL   naproxen sodium 220 MG tablet Commonly known as: ALEVE       TAKE  these medications    acetaminophen 325 MG tablet Commonly known as: TYLENOL Take 2 tablets (650 mg total) by mouth every 6 (six) hours as needed for mild pain (pain score 1-3), moderate pain (pain score 4-6), fever or headache.   cephALEXin 500 MG capsule Commonly known as: Keflex Take 1 capsule (500 mg total) by mouth 3 (three) times daily for 5 days.   doxycycline 100 MG tablet Commonly known as: VIBRA-TABS Take 1 tablet (100 mg total) by mouth 2 (two) times daily for 5 days. Replaces: doxycycline 100 MG capsule   Eliquis DVT/PE Starter Pack Generic drug: Apixaban Starter Pack (10mg  and 5mg ) Take as directed on package: start with two-5mg  tablets twice daily for 7 days. On day 8, switch to one-5mg  tablet twice daily.   apixaban 5 MG Tabs tablet Commonly known as: ELIQUIS Take 1 tablet (5 mg total) by mouth 2 (two) times daily. Start taking on: September 10, 2023   nicotine 21 mg/24hr patch Commonly known as: NICODERM CQ - dosed in mg/24 hours Place 1 patch (21 mg total) onto the skin daily for 28 days.        Major procedures and Radiology Reports - PLEASE review detailed and final reports for all details, in brief -   ***  ECHOCARDIOGRAM COMPLETE  Result Date: 08/12/2023    ECHOCARDIOGRAM REPORT   Patient Name:   Glenn Oconnor Date of Exam: 08/12/2023 Medical Rec #:  [id number]        Height:       72.0 in Accession #:    [id number]       Weight:       164.5 lb Date of Birth:  09-16-1976        BSA:          1.961 m Patient Age:    47 years         BP:           118/68 mmHg Patient Gender: M                HR:           93 bpm. [facility] Procedure: 2D Echo, Cardiac Doppler, Color Doppler and Intracardiac            Opacification Agent Indications:    Pulmonary Embolus I26.09  History:  Patient has no prior history of Echocardiogram examinations.                 Risk Factors:Current Smoker.  Sonographer:    Celesta Gentile RCS Referring Phys: [phone]  Heloise Beecham Fredric Slabach IMPRESSIONS  1. Left ventricular ejection fraction, by estimation, is 60 to 65%. The left ventricle has normal function. The left ventricle has no regional wall motion abnormalities. Left ventricular diastolic parameters were normal.  2. Right ventricular systolic function is mild to moderately reduced. The right ventricular size is normal. Tricuspid regurgitation signal is inadequate for assessing PA pressure.  3. The mitral valve is normal in structure. No evidence of mitral valve regurgitation. No evidence of mitral stenosis.  4. The aortic valve is tricuspid. Aortic valve regurgitation is not visualized. No aortic stenosis is present.  5. The inferior vena cava is normal in size with greater than 50% respiratory variability, suggesting right atrial pressure of 3 mmHg. Comparison(s): No prior Echocardiogram. Conclusion(s)/Recommendation(s): No left ventricular mural or apical thrombus/thrombi. FINDINGS  Left Ventricle: Left ventricular ejection fraction, by estimation, is 60 to 65%. The left ventricle has normal function. The left ventricle has no regional wall motion abnormalities. Definity contrast agent was given IV to delineate the left ventricular  endocardial borders. The left ventricular internal cavity size was normal in size. There is no left ventricular hypertrophy. Left ventricular diastolic parameters were normal. Right Ventricle: The right ventricular size is normal. No increase in right ventricular wall thickness. Right ventricular systolic function is mild to moderately reduced. Tricuspid regurgitation signal is inadequate for assessing PA pressure. Left Atrium: Left atrial size was normal in size. Right Atrium: Right atrial size was normal in size. Pericardium: There is no evidence of pericardial effusion. Mitral Valve: The mitral valve is normal in structure. No evidence of mitral valve regurgitation. No evidence of mitral valve stenosis. Tricuspid Valve: The tricuspid valve  is normal in structure. Tricuspid valve regurgitation is trivial. No evidence of tricuspid stenosis. Aortic Valve: The aortic valve is tricuspid. Aortic valve regurgitation is not visualized. No aortic stenosis is present. Aortic valve mean gradient measures 5.0 mmHg. Aortic valve peak gradient measures 8.3 mmHg. Aortic valve area, by VTI measures 1.99 cm. Pulmonic Valve: The pulmonic valve was normal in structure. Pulmonic valve regurgitation is not visualized. No evidence of pulmonic stenosis. Aorta: The aortic root is normal in size and structure. Venous: The inferior vena cava is normal in size with greater than 50% respiratory variability, suggesting right atrial pressure of 3 mmHg. IAS/Shunts: No atrial level shunt detected by color flow Doppler.  LEFT VENTRICLE PLAX 2D LVIDd:         5.00 cm   Diastology LVIDs:         2.90 cm   LV e' medial:    8.05 cm/s LV PW:         0.90 cm   LV E/e' medial:  10.6 LV IVS:        1.00 cm   LV e' lateral:   13.15 cm/s LVOT diam:     1.80 cm   LV E/e' lateral: 6.5 LV SV:         54 LV SV Index:   28 LVOT Area:     2.54 cm  RIGHT VENTRICLE RV S prime:     15.80 cm/s TAPSE (M-mode): 2.2 cm LEFT ATRIUM             Index        RIGHT ATRIUM  Index LA diam:        3.20 cm 1.63 cm/m   RA Area:     15.20 cm LA Vol (A2C):   31.9 ml 16.27 ml/m  RA Volume:   41.60 ml  21.22 ml/m LA Vol (A4C):   58.7 ml 29.94 ml/m LA Biplane Vol: 49.2 ml 25.09 ml/m  AORTIC VALVE AV Area (Vmax):    2.23 cm AV Area (Vmean):   2.07 cm AV Area (VTI):     1.99 cm AV Vmax:           144.00 cm/s AV Vmean:          103.000 cm/s AV VTI:            0.273 m AV Peak Grad:      8.3 mmHg AV Mean Grad:      5.0 mmHg LVOT Vmax:         126.00 cm/s LVOT Vmean:        83.700 cm/s LVOT VTI:          0.214 m LVOT/AV VTI ratio: 0.78  AORTA Ao Root diam: 3.60 cm MITRAL VALVE MV Area (PHT): 3.85 cm    SHUNTS MV Decel Time: 197 msec    Systemic VTI:  0.21 m MV E velocity: 85.20 cm/s  Systemic Diam: 1.80  cm MV A velocity: 73.30 cm/s MV E/A ratio:  1.16 Vishnu Priya Mallipeddi Electronically signed by Winfield Rast Mallipeddi Signature Date/Time: 08/12/2023/8:52:37 PM    Final    US Venous Img Lower Bilateral (DVT)  Result Date: 08/12/2023 CLINICAL DATA:  History of pulmonary embolus. EXAM: BILATERAL LOWER EXTREMITY VENOUS DOPPLER ULTRASOUND TECHNIQUE: Gray-scale sonography with graded compression, as well as color Doppler and duplex ultrasound were performed to evaluate the lower extremity deep venous systems from the level of the common femoral vein and including the common femoral, femoral, profunda femoral, popliteal and calf veins including the posterior tibial, peroneal and gastrocnemius veins when visible. The superficial great saphenous vein was also interrogated. Spectral Doppler was utilized to evaluate flow at rest and with distal augmentation maneuvers in the common femoral, femoral and popliteal veins. COMPARISON:  August 11, 2023. FINDINGS: RIGHT LOWER EXTREMITY Common Femoral Vein: No evidence of thrombus. Normal compressibility, respiratory phasicity and response to augmentation. Saphenofemoral Junction: No evidence of thrombus. Normal compressibility and flow on color Doppler imaging. Profunda Femoral Vein: No evidence of thrombus. Normal compressibility and flow on color Doppler imaging. Femoral Vein: No evidence of thrombus. Normal compressibility, respiratory phasicity and response to augmentation. Popliteal Vein: No evidence of thrombus. Normal compressibility, respiratory phasicity and response to augmentation. Calf Veins: No evidence of thrombus. Normal compressibility and flow on color Doppler imaging. Superficial Great Saphenous Vein: No evidence of thrombus. Normal compressibility. Venous Reflux:  None. Other Findings:  None. LEFT LOWER EXTREMITY Common Femoral Vein: No evidence of thrombus. Normal compressibility, respiratory phasicity and response to augmentation. Saphenofemoral  Junction: No evidence of thrombus. Normal compressibility and flow on color Doppler imaging. Profunda Femoral Vein: No evidence of thrombus. Normal compressibility and flow on color Doppler imaging. Femoral Vein: No evidence of thrombus. Normal compressibility, respiratory phasicity and response to augmentation. Popliteal Vein: Noncompressible with some flow present consistent with nonocclusive thrombus. Calf Veins: There is noted occlusive thrombus in the left posterior tibial vein. Superficial Great Saphenous Vein: No evidence of thrombus. Normal compressibility. Venous Reflux:  None. Other Findings:  None. IMPRESSION: Deep venous thrombosis is noted in the left popliteal and posterior tibial veins. No  evidence of deep venous thrombosis seen in right lower extremity. These results will be called to the ordering clinician or representative by the Radiologist Assistant, and communication documented in the PACS or zVision Dashboard. Electronically Signed   By: Lupita Raider M.D.   On: 08/12/2023 14:30   CT Angio Chest PE W and/or Wo Contrast  Result Date: 08/11/2023 CLINICAL DATA:  Pulmonary embolism (PE) suspected, high prob. Hematemesis. EXAM: CT ANGIOGRAPHY CHEST WITH CONTRAST TECHNIQUE: Multidetector CT imaging of the chest was performed using the standard protocol during bolus administration of intravenous contrast. Multiplanar CT image reconstructions and MIPs were obtained to evaluate the vascular anatomy. RADIATION DOSE REDUCTION: This exam was performed according to the departmental dose-optimization program which includes automated exposure control, adjustment of the mA and/or kV according to patient size and/or use of iterative reconstruction technique. CONTRAST:  75mL OMNIPAQUE IOHEXOL 350 MG/ML SOLN COMPARISON:  None Available. FINDINGS: Cardiovascular: Satisfactory opacification of the pulmonary arteries to the segmental level. Intraluminal filling defect compatible with acute pulmonary embolism  within the left main pulmonary artery, at the bifurcation of the left upper lobe segmental branches and left interlobar artery, with filling defects extending into the posterior left upper lobe segmental branches, throughout the left interlobar artery and distally into multiple left lower lobe segmental branches. Intraluminal filling defect is also seen within the right lower lobe segmental branches. Normal heart size. No definite evidence of right heart strain. No pericardial effusion. Mediastinum/Nodes: No enlarged mediastinal, hilar, or axillary lymph nodes. Thyroid gland, trachea, and esophagus demonstrate no significant findings. Lungs/Pleura: Small left pleural effusion with basilar atelectasis. Patchy ground-glass changes with peripheral predominant wedge-shaped opacities within the posterior left lower lobe. Linear atelectasis at the right lung base. No pneumothorax. Upper Abdomen: No acute abnormality. Musculoskeletal: No acute osseous abnormality. Review of the MIP images confirms the above findings. IMPRESSION: 1. Acute pulmonary emboli within the left main pulmonary artery extending to the left interlobar artery and the upper and lower lobe segmental branches. Acute pulmonary emboli also noted in the right lower lobe segmental branches. 2. Peripheral predominant wedge-shaped opacities and patchy ground-glass changes within the posterior left lower lobe are concerning for infarction. An underlying infectious/inflammatory process can not be excluded. 3. Small left pleural effusion. These results were called by telephone at the time of interpretation on 08/11/2023 at 4:07 pm to provider Meridee Score, MD, who verbally acknowledged these results. Electronically Signed   By: Hart Robinsons M.D.   On: 08/11/2023 16:23   DG Chest 2 View  Result Date: 08/10/2023 CLINICAL DATA:  Chest pain. EXAM: CHEST - 2 VIEW COMPARISON:  None Available. FINDINGS: There is an approximately 3.5 x 5.5 cm opacity overlying  the left lateral costophrenic angle with associated small left pleural effusion. Follow-up to clearing is recommended. Bilateral lung fields are otherwise clear. Right costophrenic angle is clear. Normal cardio-mediastinal silhouette. No acute osseous abnormalities. The soft tissues are within normal limits. IMPRESSION: *Small left lung lower lobe pneumonia with associated small left pleural effusion. Follow-up to clearing is recommended. Electronically Signed   By: Jules Schick M.D.   On: 08/10/2023 13:39    Micro Results   *** Recent Results (from the past 240 hour(s))  Resp panel by RT-PCR (RSV, Flu A&B, Covid) Anterior Nasal Swab     Status: None   Collection Time: 08/10/23 12:13 PM   Specimen: Anterior Nasal Swab  Result Value Ref Range Status   SARS Coronavirus 2 by RT PCR NEGATIVE NEGATIVE Final  Comment: (NOTE) SARS-CoV-2 target nucleic acids are NOT DETECTED.  The SARS-CoV-2 RNA is generally detectable in upper respiratory specimens during the acute phase of infection. The lowest concentration of SARS-CoV-2 viral copies this assay can detect is 138 copies/mL. A negative result does not preclude SARS-Cov-2 infection and should not be used as the sole basis for treatment or other patient management decisions. A negative result may occur with  improper specimen collection/handling, submission of specimen other than nasopharyngeal swab, presence of viral mutation(s) within the areas targeted by this assay, and inadequate number of viral copies(<138 copies/mL). A negative result must be combined with clinical observations, patient history, and epidemiological information. The expected result is Negative.  Fact Sheet for Patients:  BloggerCourse.com  Fact Sheet for Healthcare Providers:  SeriousBroker.it  This test is no t yet approved or cleared by the [place] FDA and  has been authorized for detection and/or diagnosis  of SARS-CoV-2 by FDA under an Emergency Use Authorization (EUA). This EUA will remain  in effect (meaning this test can be used) for the duration of the COVID-19 declaration under Section 564(b)(1) of the Act, 21 U.S.C.section 360bbb-3(b)(1), unless the authorization is terminated  or revoked sooner.       Influenza A by PCR NEGATIVE NEGATIVE Final   Influenza B by PCR NEGATIVE NEGATIVE Final    Comment: (NOTE) The Xpert Xpress SARS-CoV-2/FLU/RSV plus assay is intended as an aid in the diagnosis of influenza from Nasopharyngeal swab specimens and should not be used as a sole basis for treatment. Nasal washings and aspirates are unacceptable for Xpert Xpress SARS-CoV-2/FLU/RSV testing.  Fact Sheet for Patients: BloggerCourse.com  Fact Sheet for Healthcare Providers: SeriousBroker.it  This test is not yet approved or cleared by the [place] FDA and has been authorized for detection and/or diagnosis of SARS-CoV-2 by FDA under an Emergency Use Authorization (EUA). This EUA will remain in effect (meaning this test can be used) for the duration of the COVID-19 declaration under Section 564(b)(1) of the Act, 21 U.S.C. section 360bbb-3(b)(1), unless the authorization is terminated or revoked.     Resp Syncytial Virus by PCR NEGATIVE NEGATIVE Final    Comment: (NOTE) Fact Sheet for Patients: BloggerCourse.com  Fact Sheet for Healthcare Providers: SeriousBroker.it  This test is not yet approved or cleared by the [place] FDA and has been authorized for detection and/or diagnosis of SARS-CoV-2 by FDA under an Emergency Use Authorization (EUA). This EUA will remain in effect (meaning this test can be used) for the duration of the COVID-19 declaration under Section 564(b)(1) of the Act, 21 U.S.C. section 360bbb-3(b)(1), unless the authorization is terminated  or revoked.  Performed at [facility], [address] [place], [place] [address]   Blood Culture (routine x 2)     Status: None (Preliminary result)   Collection Time: 08/10/23  1:10 PM   Specimen: BLOOD  Result Value Ref Range Status   Specimen Description BLOOD RFOA  Final   Special Requests   Final    Blood Culture results may not be optimal due to an excessive volume of blood received in culture bottles BOTTLES DRAWN AEROBIC AND ANAEROBIC   Culture   Final    NO GROWTH 4 DAYS Performed at [facility], [address] [place], [place] [address]    Report Status PENDING  Incomplete  Blood Culture (routine x 2)     Status: None (Preliminary result)   Collection Time: 08/10/23  1:10 PM   Specimen: BLOOD  Result  Value Ref Range Status   Specimen Description BLOOD BLOOD LEFT ARM  Final   Special Requests   Final    BOTTLES DRAWN AEROBIC AND ANAEROBIC Blood Culture results may not be optimal due to an excessive volume of blood received in culture bottles   Culture   Final    NO GROWTH 4 DAYS Performed at [facility], [address] [address] [place], [place] [address]    Report Status PENDING  Incomplete  MRSA Next Gen by PCR, Nasal     Status: None   Collection Time: 08/11/23  6:21 PM   Specimen: Nasal Mucosa; Nasal Swab  Result Value Ref Range Status   MRSA by PCR Next Gen NOT DETECTED NOT DETECTED Final    Comment: (NOTE) The GeneXpert MRSA Assay (FDA approved for NASAL specimens only), is one component of a comprehensive MRSA colonization surveillance program. It is not intended to diagnose MRSA infection nor to guide or monitor treatment for MRSA infections. Test performance is not FDA approved in patients less than 4 years old. Performed at [facility], [address], [place], [place] [address]     Today   Subjective    Glenn Oconnor today has no ***          Patient has been seen and examined prior to discharge   Objective   Blood pressure 106/71, pulse 80,  temperature 98.8 F (37.1 C), temperature source Oral, resp. rate 20, height 6' (1.829 m), weight 74.6 kg, SpO2 100%.   Intake/Output Summary (Last 24 hours) at 08/14/2023 1208 Last data filed at 08/14/2023 1010 Gross per 24 hour  Intake 480 ml  Output 850 ml  Net -370 ml    Exam Gen:- Awake Alert, no acute distress *** HEENT:- [place].AT, No sclera icterus Neck-Supple Neck,No JVD,.  Lungs-  CTAB , good air movement bilaterally CV- S1, S2 normal, regular Abd-  +ve B.Sounds, Abd Soft, No tenderness,    Extremity/Skin:- No  edema,   good pulses Psych-affect is appropriate, oriented x3 Neuro-no new focal deficits, no tremors ***   Data Review   CBC w Diff:  Lab Results  Component Value Date   WBC 9.6 08/14/2023   HGB 10.6 (L) 08/14/2023   HCT 32.7 (L) 08/14/2023   PLT 340 08/14/2023    CMP:  Lab Results  Component Value Date   NA 135 08/13/2023   K 3.9 08/13/2023   CL 102 08/13/2023   CO2 26 08/13/2023   BUN <5 (L) 08/13/2023   CREATININE 0.70 08/13/2023   PROT 7.8 08/11/2023   ALBUMIN 2.9 (L) 08/11/2023   BILITOT 1.0 08/11/2023   ALKPHOS 85 08/11/2023   AST 18 08/11/2023   ALT 14 08/11/2023  .  Total Discharge time is about 33 minutes  Shon Hale M.D on 08/14/2023 at 12:08 PM  Go to [email or web address] -  for contact info  [facility] - Office  [phone]

## 2023-08-14 NOTE — Plan of Care (Signed)
  Problem: Clinical Measurements: Goal: Ability to maintain clinical measurements within normal limits will improve Outcome: Progressing Goal: Will remain free from infection Outcome: Progressing Goal: Respiratory complications will improve Outcome: Progressing Goal: Cardiovascular complication will be avoided Outcome: Progressing   Problem: Pain Management: Goal: General experience of comfort will improve Outcome: Progressing

## 2023-08-15 LAB — CULTURE, BLOOD (ROUTINE X 2)
Culture: NO GROWTH
Culture: NO GROWTH

## 2023-08-26 ENCOUNTER — Encounter: Payer: Self-pay | Admitting: Internal Medicine

## 2023-09-01 ENCOUNTER — Encounter: Payer: Self-pay | Admitting: Internal Medicine

## 2023-09-14 ENCOUNTER — Inpatient Hospital Stay: Payer: No Typology Code available for payment source

## 2023-09-14 ENCOUNTER — Inpatient Hospital Stay: Payer: No Typology Code available for payment source | Admitting: Oncology

## 2023-09-14 ENCOUNTER — Inpatient Hospital Stay: Payer: Medicaid Other | Admitting: Oncology

## 2023-09-15 ENCOUNTER — Inpatient Hospital Stay: Payer: Medicaid Other

## 2023-09-15 ENCOUNTER — Inpatient Hospital Stay: Payer: Medicaid Other | Attending: Hematology | Admitting: Hematology

## 2023-09-15 NOTE — Progress Notes (Incomplete)
[facility] [address], [place] [address]   Clinic Day:  09/15/2023  Referring physician: Alvina Filbert, MD  Patient Care Team: Alvina Filbert, MD as PCP - General (Internal Medicine)   ASSESSMENT & PLAN:   Assessment: ***  Plan: ***  No orders of the defined types were placed in this encounter.     Alben Deeds Teague,acting as a Neurosurgeon for Doreatha Massed, MD.,have documented all relevant documentation on the behalf of Doreatha Massed, MD,as directed by  Doreatha Massed, MD while in the presence of Doreatha Massed, MD.   ***  Lissa Hoard R Teague   12/4/202410:12 AM  CHIEF COMPLAINT/PURPOSE OF CONSULT:   Diagnosis: ***  Current Therapy:  ***  HISTORY OF PRESENT ILLNESS:   Glenn Oconnor is a 47 y.o. 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  Patient was admitted to the hospital on 08/11/23 after presenting with hemoptysis, dyspnea, and chest pain in the ED. CT abdomen showed acute pulmonary emboli within the left main pulmonary artery extending to the left interlobar artery and the upper and lower lobe segmental branches; acute pulmonary emboli in the right lower lobe segmental branches; and peripheral predominant wedge-shaped opacities and patchy ground-glass changes within the posterior left lower lobe concerning for infarction. A bilateral lower extremity US venous was done as well that showed DVT in the left popliteal and posterior tibial veins. After unsuccessful response to IV heparin, he was switched to Lovenox while hospitalized, and was discharged on Eliquis 5 mg BID.   He was previously in the ED on 08/10/23 for pneumonia with a CXR that showed small left lower lobe pneumonia with associated small left pleural effusion. He was discharged with doxycycline and Norco after being treated with ceftriaxone and azithromycin in the ED.   He was seen by his PCP  on 08/23/23, with his PSA levels checked and %Free PSA low at 17%. Free and Total PSA were WNL.Referral to GI was made for a colonoscopy, as was referral to me to discuss length of anticoagulation treatment.   Today, he states that he is doing well overall. His appetite level is at ***%. His energy level is at ***%.  ***He was found to have abnormal CBC from *** ***He denies recent chest pain on exertion, shortness of breath on minimal exertion, pre-syncopal episodes, or palpitations. ***He had not noticed any recent bleeding such as epistaxis, hematuria or hematochezia ***The patient denies over the counter NSAID ingestion. He is not *** on antiplatelets agents. His last colonoscopy was *** ***He had no prior history or diagnosis of cancer. He denies any family history of cancer.  *** His age appropriate screening programs are up-to-date. ***He denies any pica and eats a variety of diet. ***He never donated blood or received blood transfusion. ***The patient was prescribed oral iron supplements and he takes ***  PAST MEDICAL HISTORY:   Past Medical History: No past medical history on file.  Surgical History: Past Surgical History:  Procedure Laterality Date   BACK SURGERY      Social History: Social History   Socioeconomic History   Marital status: Married    Spouse name: Not on file   Number of children: Not on file   Years of education: Not on file   Highest education level: Not on file  Occupational History   Not on file  Tobacco Use   Smoking status: Some Days  Types: Cigarettes   Smokeless tobacco: Never  Substance and Sexual Activity   Alcohol use: Yes   Drug use: Never   Sexual activity: Not on file  Other Topics Concern   Not on file  Social History Narrative   Not on file   Social Determinants of Health   Financial Resource Strain: Not on file  Food Insecurity: No Food Insecurity (08/11/2023)   Hunger Vital Sign    Worried About Running Out of Food  in the Last Year: Never true    Ran Out of Food in the Last Year: Never true  Transportation Needs: No Transportation Needs (08/11/2023)   PRAPARE - Administrator, Civil Service (Medical): No    Lack of Transportation (Non-Medical): No  Physical Activity: Not on file  Stress: Not on file  Social Connections: Not on file  Intimate Partner Violence: Not At Risk (08/11/2023)   Humiliation, Afraid, Rape, and Kick questionnaire    Fear of Current or Ex-Partner: No    Emotionally Abused: No    Physically Abused: No    Sexually Abused: No    Family History: No family history on file.  Current Medications:  Current Outpatient Medications:    acetaminophen (TYLENOL) 325 MG tablet, Take 2 tablets (650 mg total) by mouth every 6 (six) hours as needed for mild pain (pain score 1-3), moderate pain (pain score 4-6), fever or headache., Disp: , Rfl:    apixaban (ELIQUIS) 5 MG TABS tablet, Take 1 tablet (5 mg total) by mouth 2 (two) times daily., Disp: 60 tablet, Rfl: 5   Apixaban Starter Pack, 10mg  and 5mg , (ELIQUIS DVT/PE STARTER PACK), Take as directed on package: start with two-5mg  tablets twice daily for 7 days. On day 8, switch to one-5mg  tablet twice daily., Disp: 1 each, Rfl: 0   Allergies: No Known Allergies  REVIEW OF SYSTEMS:   Review of Systems  Constitutional:  Negative for chills, fatigue and fever.  HENT:   Negative for lump/mass, mouth sores, nosebleeds, sore throat and trouble swallowing.   Eyes:  Negative for eye problems.  Respiratory:  Negative for cough and shortness of breath.   Cardiovascular:  Negative for chest pain, leg swelling and palpitations.  Gastrointestinal:  Negative for abdominal pain, constipation, diarrhea, nausea and vomiting.  Genitourinary:  Negative for bladder incontinence, difficulty urinating, dysuria, frequency, hematuria and nocturia.   Musculoskeletal:  Negative for arthralgias, back pain, flank pain, myalgias and neck pain.  Skin:   Negative for itching and rash.  Neurological:  Negative for dizziness, headaches and numbness.  Hematological:  Does not bruise/bleed easily.  Psychiatric/Behavioral:  Negative for depression, sleep disturbance and suicidal ideas. The patient is not nervous/anxious.   All other systems reviewed and are negative.    VITALS:   There were no vitals taken for this visit.  Wt Readings from Last 3 Encounters:  08/11/23 164 lb 7.4 oz (74.6 kg)  08/10/23 170 lb (77.1 kg)  06/03/21 175 lb (79.4 kg)    There is no height or weight on file to calculate BMI.   PHYSICAL EXAM:   Physical Exam Vitals and nursing note reviewed. Exam conducted with a chaperone present.  Constitutional:      Appearance: Normal appearance.  Cardiovascular:     Rate and Rhythm: Normal rate and regular rhythm.     Pulses: Normal pulses.     Heart sounds: Normal heart sounds.  Pulmonary:     Effort: Pulmonary effort is normal.  Breath sounds: Normal breath sounds.  Abdominal:     Palpations: Abdomen is soft. There is no hepatomegaly, splenomegaly or mass.     Tenderness: There is no abdominal tenderness.  Musculoskeletal:     Right lower leg: No edema.     Left lower leg: No edema.  Lymphadenopathy:     Cervical: No cervical adenopathy.     Right cervical: No superficial, deep or posterior cervical adenopathy.    Left cervical: No superficial, deep or posterior cervical adenopathy.     Upper Body:     Right upper body: No supraclavicular or axillary adenopathy.     Left upper body: No supraclavicular or axillary adenopathy.  Neurological:     General: No focal deficit present.     Mental Status: He is alert and oriented to person, place, and time.  Psychiatric:        Mood and Affect: Mood normal.        Behavior: Behavior normal.     LABS:      Latest Ref Rng & Units 08/14/2023    5:22 AM 08/13/2023    4:39 AM 08/12/2023    6:50 AM  CBC  WBC 4.0 - 10.5 K/uL 9.6  11.6  13.8   Hemoglobin 13.0  - 17.0 g/dL 62.1  30.8  65.7   Hematocrit 39.0 - 52.0 % 32.7  31.7  32.2   Platelets 150 - 400 K/uL 340  277  234       Latest Ref Rng & Units 08/13/2023    4:39 AM 08/12/2023    6:50 AM 08/11/2023   12:18 PM  CMP  Glucose 70 - 99 mg/dL 846  962  952   BUN 6 - 20 mg/dL <5  <5  5   Creatinine 0.61 - 1.24 mg/dL 8.41  3.24  4.01   Sodium 135 - 145 mmol/L 135  132  133   Potassium 3.5 - 5.1 mmol/L 3.9  3.6  3.5   Chloride 98 - 111 mmol/L 102  100  100   CO2 22 - 32 mmol/L 26  23  24    Calcium 8.9 - 10.3 mg/dL 8.5  8.1  8.4   Total Protein 6.5 - 8.1 g/dL   7.8   Total Bilirubin 0.3 - 1.2 mg/dL   1.0   Alkaline Phos 38 - 126 U/L   85   AST 15 - 41 U/L   18   ALT 0 - 44 U/L   14      No results found for: "CEA1", "CEA" / No results found for: "CEA1", "CEA" No results found for: "PSA1" No results found for: "UUV253" No results found for: "CAN125"  No results found for: "TOTALPROTELP", "ALBUMINELP", "A1GS", "A2GS", "BETS", "BETA2SER", "GAMS", "MSPIKE", "SPEI" No results found for: "TIBC", "FERRITIN", "IRONPCTSAT" No results found for: "LDH"   STUDIES:   No results found.
